# Patient Record
Sex: Male | Born: 1965
Health system: Southern US, Community
[De-identification: ages and names within clinical notes are randomized; demographics above are authoritative.]

## PROBLEM LIST (undated history)

## (undated) DIAGNOSIS — Z9989 Dependence on other enabling machines and devices: Secondary | ICD-10-CM

## (undated) DIAGNOSIS — I4892 Unspecified atrial flutter: Secondary | ICD-10-CM

## (undated) DIAGNOSIS — E781 Pure hyperglyceridemia: Secondary | ICD-10-CM

## (undated) DIAGNOSIS — E785 Hyperlipidemia, unspecified: Secondary | ICD-10-CM

## (undated) DIAGNOSIS — I4819 Other persistent atrial fibrillation: Secondary | ICD-10-CM

## (undated) DIAGNOSIS — Z89511 Acquired absence of right leg below knee: Secondary | ICD-10-CM

## (undated) DIAGNOSIS — G4733 Obstructive sleep apnea (adult) (pediatric): Secondary | ICD-10-CM

## (undated) DIAGNOSIS — Z23 Encounter for immunization: Secondary | ICD-10-CM

## (undated) HISTORY — DX: Other persistent atrial fibrillation: I48.19

## (undated) HISTORY — DX: Hyperlipidemia, unspecified: E78.5

## (undated) HISTORY — DX: Pure hyperglyceridemia: E78.1

## (undated) HISTORY — DX: Unspecified atrial flutter: I48.92

## (undated) HISTORY — DX: Acquired absence of right leg below knee: Z89.511

## (undated) HISTORY — DX: Encounter for immunization: Z23

---

## 2002-05-18 HISTORY — PX: BELOW KNEE LEG AMPUTATION: SUR23

## 2010-11-21 ENCOUNTER — Encounter: Payer: Self-pay | Admitting: Cardiology

## 2011-01-14 ENCOUNTER — Ambulatory Visit (INDEPENDENT_AMBULATORY_CARE_PROVIDER_SITE_OTHER): Payer: BC Managed Care – PPO | Admitting: Cardiology

## 2011-01-14 ENCOUNTER — Encounter: Payer: Self-pay | Admitting: Cardiology

## 2011-01-14 DIAGNOSIS — E781 Pure hyperglyceridemia: Secondary | ICD-10-CM

## 2011-01-14 DIAGNOSIS — R002 Palpitations: Secondary | ICD-10-CM

## 2011-01-14 HISTORY — DX: Pure hyperglyceridemia: E78.1

## 2011-01-14 NOTE — Assessment & Plan Note (Signed)
His palpitations have been more frequent. He has a normal cardiac exam and ECG. Recent blood work apparently was unremarkable. We will schedule him for a 24-hour Holter monitor.

## 2011-01-14 NOTE — Patient Instructions (Signed)
We will have you wear a Holter monitor and call you with the results.
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
## 2011-01-23 ENCOUNTER — Telehealth: Payer: Self-pay | Admitting: *Deleted

## 2011-01-23 DIAGNOSIS — I4891 Unspecified atrial fibrillation: Secondary | ICD-10-CM

## 2011-01-23 NOTE — Telephone Encounter (Signed)
Spoke w/wife. Notified of holter monitor results-showed Afib. Per Dr. [place] needs echo. Called Dr. Alda Berthold office and got lab results. They checked his thyroid-nml. Scheduled Echo for 9/12.

## 2011-01-28 ENCOUNTER — Telehealth: Payer: Self-pay | Admitting: *Deleted

## 2011-01-28 ENCOUNTER — Ambulatory Visit (HOSPITAL_COMMUNITY): Payer: BC Managed Care – PPO | Attending: Cardiology | Admitting: Radiology

## 2011-01-28 DIAGNOSIS — R002 Palpitations: Secondary | ICD-10-CM | POA: Insufficient documentation

## 2011-01-28 DIAGNOSIS — R072 Precordial pain: Secondary | ICD-10-CM

## 2011-01-28 DIAGNOSIS — I4891 Unspecified atrial fibrillation: Secondary | ICD-10-CM | POA: Insufficient documentation

## 2011-01-28 DIAGNOSIS — I079 Rheumatic tricuspid valve disease, unspecified: Secondary | ICD-10-CM | POA: Insufficient documentation

## 2011-01-28 NOTE — Telephone Encounter (Signed)
Message copied by Lorayne Bender on Wed Jan 28, 2011  2:18 PM ------      Message from: [place], PETER M      Created: Wed Jan 28, 2011 12:47 PM       Echo looks very good, only mild LA enlargement. Otherwise normal      Peter [place]

## 2011-01-28 NOTE — Telephone Encounter (Signed)
Lm w/echo results. Still will need to see next week 9/19.

## 2011-02-04 ENCOUNTER — Encounter: Payer: Self-pay | Admitting: Cardiology

## 2011-02-04 ENCOUNTER — Ambulatory Visit (INDEPENDENT_AMBULATORY_CARE_PROVIDER_SITE_OTHER): Payer: BC Managed Care – PPO | Admitting: Cardiology

## 2011-02-04 VITALS — BP 118/80 | HR 78 | Ht 73.0 in | Wt 218.0 lb

## 2011-02-04 DIAGNOSIS — I4891 Unspecified atrial fibrillation: Secondary | ICD-10-CM

## 2011-02-04 DIAGNOSIS — I480 Paroxysmal atrial fibrillation: Secondary | ICD-10-CM | POA: Insufficient documentation

## 2011-02-04 MED ORDER — METOPROLOL SUCCINATE ER 25 MG PO TB24: 25.0000 mg | ORAL_TABLET | Freq: Every day | ORAL | Status: DC

## 2011-02-04 NOTE — Assessment & Plan Note (Signed)
Holter monitor demonstrated a single episode of atrial fibrillation. The remainder of his workup has been unremarkable. This would place him at low risk category. His [place] score is 0. I recommended a trial of metoprolol 25 mg daily. He will remain on baby aspirin daily. Will followup again in 3 months. Since he is only minimally symptomatic I have not recommended antiarrhythmic drug therapy.

## 2011-02-04 NOTE — Patient Instructions (Signed)
We will try you on metoprolol 25 mg daily to see if this will reduce your palpitations.  Avoid caffeine.  I will see you again in 3 months.
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
## 2011-04-24 ENCOUNTER — Ambulatory Visit (INDEPENDENT_AMBULATORY_CARE_PROVIDER_SITE_OTHER): Payer: BC Managed Care – PPO | Admitting: Cardiology

## 2011-04-24 ENCOUNTER — Encounter: Payer: Self-pay | Admitting: Cardiology

## 2011-04-24 VITALS — BP 118/70 | HR 61 | Ht 73.0 in | Wt 223.0 lb

## 2011-04-24 DIAGNOSIS — I4891 Unspecified atrial fibrillation: Secondary | ICD-10-CM

## 2011-04-24 DIAGNOSIS — I480 Paroxysmal atrial fibrillation: Secondary | ICD-10-CM
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
## 2011-04-24 NOTE — Patient Instructions (Signed)
Continue on the Toprol and ASA.  I will see you again in 6 months.

## 2011-04-24 NOTE — Assessment & Plan Note (Signed)
He has had a fairly dramatic improvement with oral metoprolol. I recommended continuing on this and his baby aspirin. He'll follow up again in 6 months.

## 2015-05-03 ENCOUNTER — Ambulatory Visit (INDEPENDENT_AMBULATORY_CARE_PROVIDER_SITE_OTHER): Payer: BLUE CROSS/BLUE SHIELD | Admitting: Medical

## 2015-05-03 ENCOUNTER — Institutional Professional Consult (permissible substitution): Payer: Self-pay | Admitting: Medical

## 2015-05-03 ENCOUNTER — Encounter: Payer: Self-pay | Admitting: Medical

## 2015-05-03 VITALS — BP 120/82 | HR 83 | Ht 72.5 in | Wt 227.0 lb

## 2015-05-03 DIAGNOSIS — R000 Tachycardia, unspecified: Secondary | ICD-10-CM | POA: Diagnosis not present

## 2015-05-03 DIAGNOSIS — Z89511 Acquired absence of right leg below knee: Secondary | ICD-10-CM

## 2015-05-03 DIAGNOSIS — Z8249 Family history of ischemic heart disease and other diseases of the circulatory system: Secondary | ICD-10-CM | POA: Diagnosis not present

## 2015-05-03 DIAGNOSIS — Z449 Encounter for fitting and adjustment of unspecified external prosthetic device: Secondary | ICD-10-CM | POA: Diagnosis not present

## 2015-05-03 DIAGNOSIS — Z8679 Personal history of other diseases of the circulatory system: Secondary | ICD-10-CM

## 2015-05-03 NOTE — Progress Notes (Signed)
Subjective: Chief Complaint  Patient presents with  . New Patient (Initial Visit)  . prosthetic leg issues    needs rx for ouside and inside liner and socks etc. said he needs a new leg. but not sure how long the rx is good for   Here as a new patient today.   Doesn't go to the doctor regularly.   Has right BTK prosthesis.  Usually gets updated prosthesis every few years as technology changes.   Needs new liners and is due for new prosthesis.   Overall doing well, no particular c/o.  Exercise 3 days per week without problems.   Been Psychiatrist.     He does have hx/o paroxysmal Afib after using artificial sweetener back several years ago.   otherwise been in usual state of health.  Denies palpitations, SOB, chest pain, dyspnea, dizziness, fatigue.  No other aggravating or relieving factors. No other complaint.  Past Medical History  Diagnosis Date  . Hyperlipidemia   . Fatigue   . Paroxysmal a-fib (HCC)    ROS as in subjective  Objective: BP 120/82 mmHg  Pulse 83  Ht 6' 0.5" (1.842 m)  Wt 227 lb (102.967 kg)  BMI 30.35 kg/m2  SpO2 98%  Gen: wd, wn, nad white male Right BTK prosthesis in place, otherwise legs nontender, normal ROM Neck: supple, nontender, no mass, no thyromegaly Lungs clear Heart: tachycardic, otherwise RRR, no murmurs Pulses 2+ symmetric UE and LE No left leg edema    Assessment: Encounter Diagnoses  Name Primary?  . Status post below knee amputation of right lower extremity ([place]) Yes  . Prosthesis adjustment   . Tachycardia   . Family history of premature CAD   . History of atrial fibrillation     Plan: On pre sensation he was fine, never seemed to be symptomatic from a cardiac standpoint, but while checking vitals CMA had discrepancies on pulse, and after I personally checked his pulse he seemed to be tachycardic in the 140 range at rest.   Discussed his history , possible causes, and advised labs, EKG for help evaluate.   He  reports drinking sweet tea just before coming in today.  He refused any EKG or labs or other eval today.  discussed risks of not checking things out further.  At this point, he left against advice.   He states he will return for a physical and labs.   Advised that if he gets chest pain, dyspnea, SOB, palpations, or other symptoms as discussed to call 911 or get evaluated.   Otherwise he refused other eval today.  Discussed with Dr. Redmond School as well.   New RX given for prosthesis and supplies.    Return soon for physical, fasting labs

## 2015-05-04 ENCOUNTER — Encounter (HOSPITAL_COMMUNITY): Payer: Self-pay | Admitting: Emergency Medicine

## 2015-05-04 ENCOUNTER — Emergency Department (HOSPITAL_COMMUNITY)
Admission: EM | Admit: 2015-05-04 | Discharge: 2015-05-04 | Disposition: A | Discharge status: Home / Self Care | Payer: BLUE CROSS/BLUE SHIELD | Attending: Emergency Medicine | Admitting: Emergency Medicine

## 2015-05-04 DIAGNOSIS — I483 Typical atrial flutter: Secondary | ICD-10-CM | POA: Diagnosis not present

## 2015-05-04 DIAGNOSIS — I4892 Unspecified atrial flutter: Secondary | ICD-10-CM

## 2015-05-04 DIAGNOSIS — Z79899 Other long term (current) drug therapy: Secondary | ICD-10-CM | POA: Diagnosis not present

## 2015-05-04 DIAGNOSIS — Z8639 Personal history of other endocrine, nutritional and metabolic disease: Secondary | ICD-10-CM | POA: Diagnosis not present

## 2015-05-04 DIAGNOSIS — R002 Palpitations: Secondary | ICD-10-CM | POA: Diagnosis present

## 2015-05-04 DIAGNOSIS — Z7982 Long term (current) use of aspirin: Secondary | ICD-10-CM | POA: Insufficient documentation

## 2015-05-04 LAB — BASIC METABOLIC PANEL
ANION GAP: 7 (ref 5–15)
BUN: 13 mg/dL (ref 6–20)
CALCIUM: 9 mg/dL (ref 8.9–10.3)
CO2: 27 mmol/L (ref 22–32)
Chloride: 102 mmol/L (ref 101–111)
Creatinine, Ser: 0.98 mg/dL (ref 0.61–1.24)
GFR calc Af Amer: >60 mL/min (ref >60)
Glucose, Bld: 109 mg/dL — ABNORMAL HIGH (ref 65–99)
POTASSIUM: 4.1 mmol/L (ref 3.5–5.1)
SODIUM: 136 mmol/L (ref 135–145)

## 2015-05-04 LAB — CBC WITH DIFFERENTIAL/PLATELET
BASOS ABS: 0.1 10*3/uL (ref 0.0–0.1)
BASOS PCT: 1 %
EOS ABS: 0.2 10*3/uL (ref 0.0–0.7)
Eosinophils Relative: 2 %
HCT: 44.8 % (ref 39.0–52.0)
Hemoglobin: 15.2 g/dL (ref 13.0–17.0)
LYMPHS ABS: 1.7 10*3/uL (ref 0.7–4.0)
LYMPHS PCT: 25 %
MCH: 32.4 pg (ref 26.0–34.0)
MCHC: 33.9 g/dL (ref 30.0–36.0)
MCV: 95.5 fL (ref 78.0–100.0)
Monocytes Absolute: 0.5 10*3/uL (ref 0.1–1.0)
Monocytes Relative: 7 %
NEUTROS PCT: 65 %
Neutro Abs: 4.3 10*3/uL (ref 1.7–7.7)
PLATELETS: 210 10*3/uL (ref 150–400)
RBC: 4.69 MIL/uL (ref 4.22–5.81)
RDW: 12.5 % (ref 11.5–15.5)
WBC: 6.7 10*3/uL (ref 4.0–10.5)

## 2015-05-04 LAB — MAGNESIUM: MAGNESIUM: 1.9 mg/dL (ref 1.7–2.4)

## 2015-05-04 LAB — TSH: TSH: 1.632 u[IU]/mL (ref 0.350–4.500)

## 2015-05-04 MED ORDER — DILTIAZEM HCL ER COATED BEADS 120 MG PO TB24: 120.0000 mg | ORAL_TABLET | Freq: Every day | ORAL | Status: DC

## 2015-05-04 MED ORDER — DILTIAZEM HCL 100 MG IV SOLR
5.0000 mg/h | INTRAVENOUS | Status: DC
  Administered 2015-05-04: 5 mg/h via INTRAVENOUS
  Filled 2015-05-04: qty 100

## 2015-05-04 MED ORDER — DILTIAZEM LOAD VIA INFUSION
20.0000 mg | INTRAVENOUS | Status: DC | PRN
  Administered 2015-05-04: 20 mg via INTRAVENOUS
  Filled 2015-05-04: qty 20

## 2015-05-04 MED ORDER — ADENOSINE 6 MG/2ML IV SOLN
6.0000 mg | Freq: Once | INTRAVENOUS | Status: AC
  Administered 2015-05-04: 6 mg via INTRAVENOUS
  Filled 2015-05-04: qty 2
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
## 2015-05-04 NOTE — Discharge Instructions (Signed)
Atrial Flutter °Atrial flutter is a heart rhythm that can cause the heart to beat very fast (tachycardia). It originates in the upper chambers of the heart (atria). In atrial flutter, the top chambers of the heart (atria) often beat much faster than the bottom chambers of the heart (ventricles). Atrial flutter has a regular "saw toothed" appearance in an EKG readout. An EKG is a test that records the electrical activity of the heart. Atrial flutter can cause the heart to beat up to 150 beats per minute (BPM). Atrial flutter can either be short lived (paroxysmal) or permanent.  °CAUSES  °Causes of atrial flutter can be many. Some of these include: °· Heart related issues: °¨ Heart attack (myocardial infarction). °¨ Heart failure. °¨ Heart valve problems. °¨ Poorly controlled high blood pressure (hypertension). °¨ After open heart surgery. °· Lung related issues: °¨ A blood clot in the lungs (pulmonary embolism). °¨ Chronic obstructive pulmonary disease (COPD). Medications used to treat COPD can attribute to atrial flutter. °· Other related causes: °¨ Hyperthyroidism. °¨ Caffeine. °¨ Some decongestant cold medications. °¨ Low electrolyte levels such as potassium or magnesium. °¨ Cocaine. °SYMPTOMS °· An awareness of your heart beating rapidly (palpitations). °· Shortness of breath. °· Chest pain. °· Low blood pressure (hypotension). °· Dizziness or fainting. °DIAGNOSIS  °Different tests can be performed to diagnose atrial flutter.  °· An EKG. °· Holter monitor. This is a 24-hour recording of your heart rhythm. You will also be given a diary. Write down all symptoms that you have and what you were doing at the time you experienced symptoms. °· Cardiac event monitor. This small device can be worn for up to 30 days. When you have heart symptoms, you will push a button on the device. This will then record your heart rhythm. °· Echocardiogram. This is an imaging test to look at your heart. Your caregiver will look at your  heart valves and the ventricles. °· Stress test. This test can help determine if the atrial flutter is related to exercise or if coronary artery disease is present. °· Laboratory studies will look at certain blood levels like: °¨ Complete blood count (CBC). °¨ Potassium. °¨ Magnesium. °¨ Thyroid function. °TREATMENT  °Treatment of atrial flutter varies. A combination of therapies may be used or sometimes atrial flutter may need only 1 type of treatment.  °Lab work: °If your blood work, such as your electrolytes (potassium, magnesium) or your thyroid function tests, are abnormal, your caregiver will treat them accordingly.  °Medication:  °There are several different types of medications that can convert your heart to a normal rhythm and prevent atrial flutter from reoccurring.  °Nonsurgical procedures: °Nonsurgical techniques may be used to control atrial flutter. Some examples include: °· Cardioversion. This technique uses either drugs or an electrical shock to restore a normal heart rhythm: °¨ Cardioversion drugs may be given through an intravenous (IV) line to help "reset" the heart rhythm. °¨ In electrical cardioversion, your caregiver shocks your heart with electrical energy. This helps to reset the heartbeat to a normal rhythm. °· Ablation. If atrial flutter is a persistent problem, an ablation may be needed. This procedure is done under mild sedation. High frequency radio-wave energy is used to destroy the area of heart tissue responsible for atrial flutter. °SEEK IMMEDIATE MEDICAL CARE IF:  °You have: °· Dizziness. °· Near fainting or fainting. °· Shortness of breath. °· Chest pain or pressure. °· Sudden nausea or vomiting. °· Profuse sweating. °If you have the above symptoms,   call your local emergency service immediately! Do not drive yourself to the hospital. °MAKE SURE YOU:  °· Understand these instructions. °· Will watch your condition. °· Will get help right away if you are not doing well or get worse. °   °This information is not intended to replace advice given to you by your health care provider. Make sure you discuss any questions you have with your health care provider. °  °Document Released: 09/20/2008 Document Revised: 05/25/2014 Document Reviewed: 11/16/2014 °Elsevier Interactive Patient Education ©2016 [employer] ° °

## 2015-05-04 NOTE — ED Notes (Signed)
Pt reports he has felt his heart beat fast off and on since last SAT. Pt reports a HX of A-Fib.

## 2015-05-04 NOTE — ED Notes (Signed)
Declined W/C at D/C and was escorted to lobby by RN. 
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
## 2015-05-06 ENCOUNTER — Telehealth: Payer: Self-pay | Admitting: Medical

## 2015-05-06 ENCOUNTER — Ambulatory Visit (INDEPENDENT_AMBULATORY_CARE_PROVIDER_SITE_OTHER): Payer: BLUE CROSS/BLUE SHIELD | Admitting: Physician Assistant

## 2015-05-06 ENCOUNTER — Encounter: Payer: Self-pay | Admitting: Physician Assistant

## 2015-05-06 VITALS — BP 122/80 | HR 77 | Ht 73.0 in | Wt 230.5 lb

## 2015-05-06 DIAGNOSIS — E781 Pure hyperglyceridemia: Secondary | ICD-10-CM | POA: Diagnosis not present

## 2015-05-06 DIAGNOSIS — I480 Paroxysmal atrial fibrillation: Secondary | ICD-10-CM

## 2015-05-06 MED ORDER — DILTIAZEM HCL ER COATED BEADS 180 MG PO CP24: 180.0000 mg | ORAL_CAPSULE | Freq: Every day | ORAL | Status: DC

## 2015-05-06 NOTE — Telephone Encounter (Signed)
Pt is on the way to cariology, [name], on [address]. Said he was in Afib and that he feels fine.  This weekend he said he was not feeling good at all. Has appt 05/07/15 with [place]

## 2015-05-06 NOTE — Patient Instructions (Addendum)
Your physician recommends that you schedule a follow-up appointment in: 3 Months with Dr Martinique  Your physician has requested that you have an echocardiogram. Echocardiography is a painless test that uses sound waves to create images of your heart. It provides your doctor with information about the size and shape of your heart and how well your heart's chambers and valves are working. This procedure takes approximately one hour. There are no restrictions for this procedure.  Your physician has recommended you make the following change in your medication: Increase Diltiazem 180 mg daily.
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
## 2015-05-06 NOTE — Telephone Encounter (Signed)
Please call and see how he is doing.  I got notification of his hospital visit, and glad he was treated to get the heart rate under control.   We pray he is doing well today.

## 2015-05-07 ENCOUNTER — Telehealth: Payer: Self-pay

## 2015-05-07 ENCOUNTER — Encounter: Payer: Self-pay | Admitting: Medical

## 2015-05-07 ENCOUNTER — Telehealth: Payer: Self-pay | Admitting: Cardiology

## 2015-05-07 ENCOUNTER — Ambulatory Visit (INDEPENDENT_AMBULATORY_CARE_PROVIDER_SITE_OTHER): Payer: BLUE CROSS/BLUE SHIELD | Admitting: Medical

## 2015-05-07 VITALS — BP 130/88 | HR 160 | Ht 73.0 in | Wt 225.0 lb

## 2015-05-07 DIAGNOSIS — Z89511 Acquired absence of right leg below knee: Secondary | ICD-10-CM | POA: Diagnosis not present

## 2015-05-07 DIAGNOSIS — I4819 Other persistent atrial fibrillation: Secondary | ICD-10-CM

## 2015-05-07 DIAGNOSIS — Z125 Encounter for screening for malignant neoplasm of prostate: Secondary | ICD-10-CM | POA: Insufficient documentation

## 2015-05-07 DIAGNOSIS — Z0000 Encounter for general adult medical examination without abnormal findings: Secondary | ICD-10-CM | POA: Insufficient documentation

## 2015-05-07 DIAGNOSIS — Z23 Encounter for immunization: Secondary | ICD-10-CM | POA: Insufficient documentation

## 2015-05-07 DIAGNOSIS — I481 Persistent atrial fibrillation: Secondary | ICD-10-CM

## 2015-05-07 HISTORY — DX: Encounter for immunization: Z23

## 2015-05-07 HISTORY — DX: Acquired absence of right leg below knee: Z89.511

## 2015-05-07 HISTORY — DX: Other persistent atrial fibrillation: I48.19

## 2015-05-07 LAB — HEPATIC FUNCTION PANEL
ALBUMIN: 4.5 g/dL (ref 3.6–5.1)
ALK PHOS: 92 U/L (ref 40–115)
ALT: 73 U/L — ABNORMAL HIGH (ref 9–46)
AST: 30 U/L (ref 10–40)
BILIRUBIN DIRECT: 0.2 mg/dL (ref <=0.2)
BILIRUBIN TOTAL: 1.2 mg/dL (ref 0.2–1.2)
Indirect Bilirubin: 1 mg/dL (ref 0.2–1.2)
Total Protein: 7.2 g/dL (ref 6.1–8.1)

## 2015-05-07 LAB — LIPID PANEL
CHOL/HDL RATIO: 5.2 ratio — AB (ref <=5.0)
CHOLESTEROL: 219 mg/dL — AB (ref 125–200)
HDL: 42 mg/dL (ref >=40)
LDL Cholesterol: 148 mg/dL — ABNORMAL HIGH (ref <130)
Triglycerides: 147 mg/dL (ref <150)
VLDL: 29 mg/dL (ref <30)

## 2015-05-07 LAB — POCT URINALYSIS DIPSTICK
Bilirubin, UA: NEGATIVE
Blood, UA: NEGATIVE
GLUCOSE UA: NEGATIVE
Ketones, UA: NEGATIVE
LEUKOCYTES UA: NEGATIVE
NITRITE UA: NEGATIVE
PROTEIN UA: NEGATIVE
SPEC GRAV UA: 1.015
UROBILINOGEN UA: NEGATIVE
pH, UA: 7.5

## 2015-05-07 NOTE — Telephone Encounter (Signed)
I would get him into the Afib clinic. He has atrial flutter and may be a good candidate for ablation.   Peter Martinique MD, [facility]

## 2015-05-07 NOTE — Telephone Encounter (Signed)
Returned call to [place] with [place] left message on personal voice mail Dr.Jordan's recommendations.Appointment scheduled at AFib clinic 05/14/15 at 3:00 pm.  Spoke to patient he is aware of appointment.Advised he will receive a letter in mail with directions where to go.Advised to call AFib clinic if he does not receive letter.AFib clinic # given.

## 2015-05-07 NOTE — Telephone Encounter (Signed)
Called the office and the cardiologist office should get back to [place] today. I told them that you leave at 2 and i leave at 3 so if it is urgent i will get sabrina and amber to listen out

## 2015-05-07 NOTE — Telephone Encounter (Signed)
Returned call to [place] with [employer].She stated patient had a physical today and was complaining of fast heart beat.Stated while he was in office pulse 80 to 160 BPM.B/P 130/88. Stated he saw Tarri Fuller PA 05/06/15, Diltiazem increased to 180 mg daily.Stated [employer] PA wanted her to ask Dr.Jordan if Diltiazem can be increased or does he need to be referred to AFib clinic.Message sent to [place] for advice.

## 2015-05-07 NOTE — Telephone Encounter (Signed)
Dr Morrison Old nurse called and is sending all i said from todays visit to dr Martinique and will call back with his advise

## 2015-05-07 NOTE — Progress Notes (Signed)
Subjective:   HPI  Jon [name] is a 49 y.o. male who presents for a complete physical.  I just met him recently as a new patient for updated script for prosthesis since he is s/p BTK amputation on the right, but over the weekend he had episode of tachycardia and afib, ended up being admitted to hospital, was begun on medication.   He doesn't feel great today, feels like the pulses is tachy and still in afib despite the dose of Cardizem being increased yesterday.  otherwise he has been in his normal state of health.  Reviewed their medical, surgical, family, social, medication, and allergy history and updated chart as appropriate.  Past Medical History  Diagnosis Date  . Hyperlipidemia   . Fatigue   . Paroxysmal a-fib [facility])     Past Surgical History  Procedure Laterality Date  . Below knee leg amputation      RIGHT LEG    Social History   Social History  . Marital Status: Married    Spouse Name: N/A  . Number of Children: 1  . Years of Education: N/A   Occupational History  . supervisor     [employer]   Social History Main Topics  . Smoking status: Never Smoker   . Smokeless tobacco: Never Used  . Alcohol Use: 1.8 oz/week    0 Standard drinks or equivalent, 3 Cans of beer per week  . Drug Use: No  . Sexual Activity: Not on file   Other Topics Concern  . Not on file   Social History Narrative   Married, 1 child, 24yo, exercises 3 times per week, eats healthy.   Works as Naval architect at [employer], Programmer, systems.     Family History  Problem Relation Age of Onset  . Heart disease Father 41    CABG  . Heart disease Brother 10    died of MI  . Cancer Neg Hx   . Stroke Neg Hx   . Diabetes Neg Hx      Current outpatient prescriptions:  .  aspirin 81 MG tablet, Take 81 mg by mouth daily.  , Disp: , Rfl:  .  diltiazem (CARDIZEM CD) 180 MG 24 hr capsule, Take 1 capsule (180 mg total) by mouth daily., Disp: 90 capsule, Rfl: 3 .  fish  oil-omega-3 fatty acids 1000 MG capsule, Take 2,400 mg by mouth daily.  , Disp: , Rfl:  .  Magnesium 500 MG TABS, Take 1 tablet by mouth daily., Disp: , Rfl:  .  Multiple Vitamin (MULTIVITAMIN) tablet, Take 1 tablet by mouth daily.  , Disp: , Rfl:   No Known Allergies   Review of Systems Constitutional: -fever, -chills, -sweats, -unexpected weight change, -decreased appetite, -fatigue Allergy: -sneezing, -itching, -congestion Dermatology: -changing moles, --rash, -lumps ENT: -runny nose, -ear pain, -sore throat, -hoarseness, -sinus pain, -teeth pain, - ringing in ears, -hearing loss, -nosebleeds Cardiology: -chest pain, +palpitations, -swelling, -difficulty breathing when lying flat, -waking up short of breath Respiratory: -cough, -shortness of breath, -difficulty breathing with exercise or exertion, -wheezing, -coughing up blood Gastroenterology: -abdominal pain, -nausea, -vomiting, -diarrhea, -constipation, -blood in stool, -changes in bowel movement, -difficulty swallowing or eating Hematology: -bleeding, -bruising  Musculoskeletal: -joint aches, -muscle aches, -joint swelling, -back pain, -neck pain, -cramping, -changes in gait Ophthalmology: denies vision changes, eye redness, itching, discharge Urology: -burning with urination, -difficulty urinating, -blood in urine, -urinary frequency, -urgency, -incontinence Neurology: -headache, -weakness, -tingling, -numbness, -memory loss, -falls, -dizziness Psychology: -depressed mood, -agitation, -  sleep problems     Objective:   Physical Exam  BP 130/88 mmHg  Pulse 80  Ht _0  (1.854 m)  Wt 225 lb (102.059 kg)  BMI 29.69 kg/m2  General appearance: alert, no distress, WD/WN,  Skin:scattered macules, no worrisome lesions HEENT: normocephalic, conjunctiva/corneas normal, sclerae anicteric, PERRLA, EOMi, nares patent, no discharge or erythema, pharynx normal Oral cavity: MMM, tongue normal, teeth in good repair Neck: supple, no  lymphadenopathy, no thyromegaly, no masses, normal ROM, no bruits Chest: non tender, normal shape and expansion Heart: tachycardic at 160, otherwise RRR, normal S1, S2, no murmurs Lungs: CTA bilaterally, no wheezes, rhonchi, or rales Abdomen: +bs, soft, non tender, non distended, no masses, no hepatomegaly, no splenomegaly, no bruits Back: non tender, normal ROM, no scoliosis Musculoskeletal: s/p right BTK amputation, otherwise upper extremities non tender, no obvious deformity, normal ROM throughout, lower extremities non tender, no obvious deformity, normal ROM throughout Extremities: no edema, no cyanosis, no clubbing Pulses: 2+ symmetric, upper and lower extremities, normal cap refill Neurological: alert, oriented x 3, CN2-12 intact, strength normal upper extremities and lower extremities, sensation normal throughout, DTRs 2-3+ throughout, no cerebellar signs, gait normal Psychiatric: normal affect, behavior normal, pleasant  GU: normal male external genitalia, circumcised, nontender, no masses, no hernia, no lymphadenopathy Rectal: deferred   Assessment and Plan :   Encounter Diagnoses  Name Primary?  . Routine general medical examination at a health care facility Yes  . Need for prophylactic vaccination and inoculation against influenza   . Persistent atrial fibrillation ([place])   . Status post below knee amputation of right lower extremity (HCC)    Physical exam - discussed healthy lifestyle, diet, exercise, preventative care, vaccinations, and addressed their concerns.   Reviewed the recent hospitalization notes, labs, and he has upcoming echocardiogram scheduled Counseled on the influenza virus vaccine.  Vaccine information sheet given.  Influenza vaccine given after consent obtained. afib - we called cardiology for advice.  Per their note yesterday, he may need f/u in Afib clinic or other treatment recommendations.    Advised he c/t Cardizem and he should hear back from [place] or  cardiology no later than in the morning. See your eye doctor yearly for routine vision care. See your dentist yearly for routine dental care including hygiene visits twice yearly. Discussed PSA and prostate cancer screening.  He defers til age 72yo. Abnormal vision screen - Of note he didn't have his glasses with him, but usually wears them. Follow-up pending labs

## 2015-05-07 NOTE — Telephone Encounter (Signed)
Returned call to [place] with [employer].[place].

## 2015-05-07 NOTE — Telephone Encounter (Signed)
°  New Prob   Pt had CPE with primary care PA and stated that he feels Cardizem after increasing dose is not working. Shawn Tysinger PA-C calling to see if pt needs to be seen by use again. Please call.

## 2015-05-08 ENCOUNTER — Encounter: Payer: Self-pay | Admitting: Medical

## 2015-05-08 ENCOUNTER — Telehealth: Payer: Self-pay

## 2015-05-08 LAB — HEMOGLOBIN A1C
HEMOGLOBIN A1C: 5.8 % — AB (ref <5.7)
Mean Plasma Glucose: 120 mg/dL — ABNORMAL HIGH (ref <117)

## 2015-05-08 NOTE — Telephone Encounter (Signed)
Got a call from Dr Morrison Old office they set him up an appt at the [facility] 05/14/15 at 3pm. Pt is aware

## 2015-05-08 NOTE — Telephone Encounter (Signed)
Any word on the cardiology f/u?

## 2015-05-08 NOTE — Telephone Encounter (Signed)
05/14/15 at 3pm he has an appt for the afib clinic and pt is aware

## 2015-05-14 ENCOUNTER — Ambulatory Visit (HOSPITAL_COMMUNITY)
Admission: RE | Admit: 2015-05-14 | Discharge: 2015-05-14 | Disposition: A | Discharge status: Home / Self Care | Payer: BLUE CROSS/BLUE SHIELD | Source: Ambulatory Visit | Attending: Nurse Practitioner | Admitting: Nurse Practitioner

## 2015-05-14 VITALS — BP 124/94 | HR 161 | Ht 73.0 in | Wt 229.0 lb

## 2015-05-14 DIAGNOSIS — I483 Typical atrial flutter: Secondary | ICD-10-CM

## 2015-05-14 DIAGNOSIS — I4892 Unspecified atrial flutter: Secondary | ICD-10-CM | POA: Diagnosis present

## 2015-05-14 NOTE — Patient Instructions (Signed)
Friday, January 6th @ 8:30am Dr. Allegra Lai [phone] [id number]

## 2015-05-15 ENCOUNTER — Other Ambulatory Visit (HOSPITAL_COMMUNITY): Payer: Self-pay

## 2015-05-15 MED ORDER — RIVAROXABAN 20 MG PO TABS: 20.0000 mg | ORAL_TABLET | Freq: Every day | ORAL | Status: DC

## 2015-05-15 MED ORDER — DILTIAZEM HCL ER COATED BEADS 180 MG PO CP24: 180.0000 mg | ORAL_CAPSULE | Freq: Two times a day (BID) | ORAL | Status: DC

## 2015-05-15 NOTE — Addendum Note (Signed)
Encounter addended by: Sherran Needs, NP on: 05/15/2015  4:35 PM<BR>     Documentation filed: Notes Section
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
## 2015-05-21 ENCOUNTER — Ambulatory Visit (HOSPITAL_COMMUNITY)

## 2015-05-21 ENCOUNTER — Emergency Department (HOSPITAL_COMMUNITY): Payer: BLUE CROSS/BLUE SHIELD

## 2015-05-21 ENCOUNTER — Encounter: Payer: Self-pay | Admitting: *Deleted

## 2015-05-21 ENCOUNTER — Encounter (HOSPITAL_COMMUNITY): Payer: Self-pay | Admitting: *Deleted

## 2015-05-21 ENCOUNTER — Inpatient Hospital Stay (HOSPITAL_COMMUNITY)
Admission: EM | Admit: 2015-05-21 | Discharge: 2015-05-23 | DRG: 310 | Disposition: A | Discharge status: Home / Self Care | Payer: BLUE CROSS/BLUE SHIELD | Attending: Cardiology | Admitting: Cardiology

## 2015-05-21 DIAGNOSIS — I483 Typical atrial flutter: Secondary | ICD-10-CM

## 2015-05-21 DIAGNOSIS — Z8249 Family history of ischemic heart disease and other diseases of the circulatory system: Secondary | ICD-10-CM

## 2015-05-21 DIAGNOSIS — I480 Paroxysmal atrial fibrillation: Secondary | ICD-10-CM | POA: Diagnosis present

## 2015-05-21 DIAGNOSIS — I4892 Unspecified atrial flutter: Secondary | ICD-10-CM

## 2015-05-21 DIAGNOSIS — Z23 Encounter for immunization: Secondary | ICD-10-CM

## 2015-05-21 DIAGNOSIS — Z79899 Other long term (current) drug therapy: Secondary | ICD-10-CM

## 2015-05-21 DIAGNOSIS — Z89511 Acquired absence of right leg below knee: Secondary | ICD-10-CM

## 2015-05-21 DIAGNOSIS — E781 Pure hyperglyceridemia: Secondary | ICD-10-CM | POA: Diagnosis present

## 2015-05-21 HISTORY — DX: Unspecified atrial flutter: I48.92

## 2015-05-21 LAB — COMPREHENSIVE METABOLIC PANEL
ALT: 26 U/L (ref 17–63)
AST: 23 U/L (ref 15–41)
Albumin: 3.3 g/dL — ABNORMAL LOW (ref 3.5–5.0)
Alkaline Phosphatase: 94 U/L (ref 38–126)
Anion gap: 11 (ref 5–15)
BUN: 10 mg/dL (ref 6–20)
CHLORIDE: 105 mmol/L (ref 101–111)
CO2: 23 mmol/L (ref 22–32)
CREATININE: 0.73 mg/dL (ref 0.61–1.24)
Calcium: 9.1 mg/dL (ref 8.9–10.3)
GFR calc Af Amer: >60 mL/min (ref >60)
GLUCOSE: 135 mg/dL — AB (ref 65–99)
Potassium: 4.5 mmol/L (ref 3.5–5.1)
Sodium: 139 mmol/L (ref 135–145)
Total Bilirubin: 1.1 mg/dL (ref 0.3–1.2)
Total Protein: 6.8 g/dL (ref 6.5–8.1)

## 2015-05-21 LAB — CBC WITH DIFFERENTIAL/PLATELET
Basophils Absolute: 0 10*3/uL (ref 0.0–0.1)
Basophils Relative: 1 %
EOS PCT: 5 %
Eosinophils Absolute: 0.3 10*3/uL (ref 0.0–0.7)
HCT: 44.9 % (ref 39.0–52.0)
Hemoglobin: 15.2 g/dL (ref 13.0–17.0)
LYMPHS PCT: 33 %
Lymphs Abs: 2 10*3/uL (ref 0.7–4.0)
MCH: 31.9 pg (ref 26.0–34.0)
MCHC: 33.9 g/dL (ref 30.0–36.0)
MCV: 94.1 fL (ref 78.0–100.0)
Monocytes Absolute: 0.5 10*3/uL (ref 0.1–1.0)
Monocytes Relative: 7 %
NEUTROS ABS: 3.3 10*3/uL (ref 1.7–7.7)
Neutrophils Relative %: 54 %
PLATELETS: 230 10*3/uL (ref 150–400)
RBC: 4.77 MIL/uL (ref 4.22–5.81)
RDW: 12.5 % (ref 11.5–15.5)
WBC: 6.2 10*3/uL (ref 4.0–10.5)

## 2015-05-21 LAB — TROPONIN I

## 2015-05-21 MED ORDER — ASPIRIN 81 MG PO CHEW
324.0000 mg | CHEWABLE_TABLET | Freq: Once | ORAL | Status: DC
  Filled 2015-05-21: qty 4

## 2015-05-21 MED ORDER — SODIUM CHLORIDE 0.9 % IV SOLN: 250.0000 mL | INTRAVENOUS | Status: DC

## 2015-05-21 MED ORDER — DILTIAZEM HCL 100 MG IV SOLR
5.0000 mg/h | Freq: Once | INTRAVENOUS | Status: AC
  Administered 2015-05-21: 10 mg/h via INTRAVENOUS
  Administered 2015-05-21: 5 mg/h via INTRAVENOUS
  Filled 2015-05-21: qty 100

## 2015-05-21 MED ORDER — OMEGA-3-ACID ETHYL ESTERS 1 G PO CAPS
1.0000 g | ORAL_CAPSULE | ORAL | Status: DC
  Administered 2015-05-21 – 2015-05-23 (×3): 1 g via ORAL
  Filled 2015-05-21 (×3): qty 1

## 2015-05-21 MED ORDER — DILTIAZEM HCL 100 MG IV SOLR
5.0000 mg/h | Freq: Once | INTRAVENOUS | Status: AC
  Administered 2015-05-21: 15 mg/h via INTRAVENOUS
  Filled 2015-05-21: qty 100

## 2015-05-21 MED ORDER — DILTIAZEM HCL 100 MG IV SOLR
5.0000 mg/h | INTRAVENOUS | Status: DC
  Administered 2015-05-21: 5 mg/h via INTRAVENOUS
  Administered 2015-05-21: 10 mg/h via INTRAVENOUS
  Administered 2015-05-22 – 2015-05-23 (×5): 15 mg/h via INTRAVENOUS
  Filled 2015-05-21 (×6): qty 100

## 2015-05-21 MED ORDER — SODIUM CHLORIDE 0.9 % IJ SOLN: 3.0000 mL | Freq: Two times a day (BID) | INTRAMUSCULAR | Status: DC

## 2015-05-21 MED ORDER — DILTIAZEM HCL 25 MG/5ML IV SOLN
10.0000 mg | Freq: Once | INTRAVENOUS | Status: AC
  Administered 2015-05-21: 10 mg via INTRAVENOUS

## 2015-05-21 MED ORDER — ADULT MULTIVITAMIN W/MINERALS CH
1.0000 | ORAL_TABLET | ORAL | Status: DC
  Administered 2015-05-21 – 2015-05-23 (×3): 1 via ORAL
  Filled 2015-05-21 (×3): qty 1

## 2015-05-21 MED ORDER — SODIUM CHLORIDE 0.9 % IJ SOLN: 3.0000 mL | INTRAMUSCULAR | Status: DC | PRN

## 2015-05-21 MED ORDER — MAGNESIUM OXIDE 400 (241.3 MG) MG PO TABS
800.0000 mg | ORAL_TABLET | ORAL | Status: DC
  Administered 2015-05-21 – 2015-05-23 (×3): 800 mg via ORAL
  Filled 2015-05-21 (×3): qty 2

## 2015-05-21 MED ORDER — DILTIAZEM HCL ER COATED BEADS 180 MG PO CP24
180.0000 mg | ORAL_CAPSULE | Freq: Two times a day (BID) | ORAL | Status: DC
  Administered 2015-05-21 – 2015-05-23 (×3): 180 mg via ORAL
  Filled 2015-05-21 (×3): qty 1

## 2015-05-21 MED ORDER — ONDANSETRON HCL 4 MG/2ML IJ SOLN: 4.0000 mg | Freq: Four times a day (QID) | INTRAMUSCULAR | Status: DC | PRN

## 2015-05-21 MED ORDER — SODIUM CHLORIDE 0.9 % IV SOLN
INTRAVENOUS | Status: DC
  Administered 2015-05-21: 20 mL via INTRAVENOUS

## 2015-05-21 MED ORDER — ACETAMINOPHEN 325 MG PO TABS: 650.0000 mg | ORAL_TABLET | ORAL | Status: DC | PRN

## 2015-05-21 MED ORDER — RIVAROXABAN 20 MG PO TABS
20.0000 mg | ORAL_TABLET | ORAL | Status: DC
Start: 2015-05-21 — End: 2015-05-23
  Administered 2015-05-21 – 2015-05-23 (×3): 20 mg via ORAL
  Filled 2015-05-21 (×3): qty 1

## 2015-05-21 MED ORDER — METOPROLOL TARTRATE 12.5 MG HALF TABLET
12.5000 mg | ORAL_TABLET | Freq: Two times a day (BID) | ORAL | Status: DC
  Administered 2015-05-21 – 2015-05-22 (×3): 12.5 mg via ORAL
  Filled 2015-05-21 (×3): qty 1

## 2015-05-21 NOTE — ED Notes (Signed)
Pt aware and informed by cards PA that he can eat.

## 2015-05-21 NOTE — Progress Notes (Signed)
Paged Dr. Aundra Dubin to make him aware Cardizem gtt was turned off during day shift for HR 40's-50's.   M.D aware. RN will give PO Cardizem 180mg  tonight and continue to monitor.  Arnell Sieving, RN

## 2015-05-21 NOTE — ED Notes (Signed)
HR ranging between 89-131 at this time. MD aware.

## 2015-05-21 NOTE — ED Notes (Signed)
Pt's heart rated noted to be 58 on monitor, last systolic BP was 98. Cardizem decreased to 10cc/hr.

## 2015-05-21 NOTE — ED Notes (Signed)
Pt to ED from home c/o tachycardia and pounding heart rate onset this morning . Pt was diagnosed 3 weeks ago with a-flutter; reports feeling an intense pounding heart rate. Pt started taking xarleto and cardizem three weeks ago. Is supposed to meet with cardiology today and on Friday for ablation Denies shortness of breath, did have an episode of diaphoresis.

## 2015-05-21 NOTE — Progress Notes (Signed)
Pt heart rate 47-55 on monitor.  Cardizem drip paused, was at 6 cc/hr.  Will cont to monitor HR.
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
## 2015-05-21 NOTE — Progress Notes (Signed)
Pt arrived to unit.  Pt arrived on cardizem drip running @ 10 ml/hr for atrial flutter.  Pt placed on telemetry and CCMD notified and confirmed of placement.  Dietary notified of Pt arrival, Pt to get lunch from wife from [place].  Pt educated on call light and telephone.  Pt denies chest pain or feeling of heart racing.  Heart rate stable at this time.  Will cont to monitor.
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
## 2015-05-22 DIAGNOSIS — I4892 Unspecified atrial flutter: Secondary | ICD-10-CM | POA: Diagnosis present

## 2015-05-22 DIAGNOSIS — Z89511 Acquired absence of right leg below knee: Secondary | ICD-10-CM | POA: Diagnosis not present

## 2015-05-22 DIAGNOSIS — Z8249 Family history of ischemic heart disease and other diseases of the circulatory system: Secondary | ICD-10-CM | POA: Diagnosis not present

## 2015-05-22 DIAGNOSIS — E781 Pure hyperglyceridemia: Secondary | ICD-10-CM | POA: Diagnosis present

## 2015-05-22 DIAGNOSIS — I480 Paroxysmal atrial fibrillation: Secondary | ICD-10-CM | POA: Diagnosis present

## 2015-05-22 DIAGNOSIS — I340 Nonrheumatic mitral (valve) insufficiency: Secondary | ICD-10-CM | POA: Diagnosis not present

## 2015-05-22 DIAGNOSIS — Z79899 Other long term (current) drug therapy: Secondary | ICD-10-CM | POA: Diagnosis not present

## 2015-05-22 DIAGNOSIS — Z23 Encounter for immunization: Secondary | ICD-10-CM | POA: Diagnosis not present

## 2015-05-22 LAB — BASIC METABOLIC PANEL
ANION GAP: 8 (ref 5–15)
BUN: 10 mg/dL (ref 6–20)
CALCIUM: 9.2 mg/dL (ref 8.9–10.3)
CO2: 29 mmol/L (ref 22–32)
CREATININE: 0.95 mg/dL (ref 0.61–1.24)
Chloride: 103 mmol/L (ref 101–111)
GFR calc Af Amer: >60 mL/min (ref >60)
GFR calc non Af Amer: >60 mL/min (ref >60)
Glucose, Bld: 120 mg/dL — ABNORMAL HIGH (ref 65–99)
Potassium: 4 mmol/L (ref 3.5–5.1)
Sodium: 140 mmol/L (ref 135–145)

## 2015-05-22 LAB — TROPONIN I: Troponin I: <0.03 ng/mL (ref <0.031)

## 2015-05-22 LAB — LIPID PANEL
CHOLESTEROL: 185 mg/dL (ref 0–200)
HDL: 33 mg/dL — AB (ref >40)
LDL CALC: 117 mg/dL — AB (ref 0–99)
TRIGLYCERIDES: 176 mg/dL — AB (ref <150)
Total CHOL/HDL Ratio: 5.6 RATIO
VLDL: 35 mg/dL (ref 0–40)

## 2015-05-22 MED ORDER — METOPROLOL TARTRATE 25 MG PO TABS
25.0000 mg | ORAL_TABLET | Freq: Two times a day (BID) | ORAL | Status: DC
  Administered 2015-05-22 – 2015-05-23 (×3): 25 mg via ORAL
  Filled 2015-05-22 (×3): qty 1

## 2015-05-22 MED ORDER — SODIUM CHLORIDE 0.9 % IJ SOLN: 3.0000 mL | INTRAMUSCULAR | Status: DC | PRN

## 2015-05-22 MED ORDER — SODIUM CHLORIDE 0.9 % IJ SOLN
3.0000 mL | Freq: Two times a day (BID) | INTRAMUSCULAR | Status: DC
  Administered 2015-05-22: 3 mL via INTRAVENOUS

## 2015-05-22 MED ORDER — SODIUM CHLORIDE 0.9 % IV SOLN
250.0000 mL | INTRAVENOUS | Status: DC
  Administered 2015-05-23: 13:00:00 via INTRAVENOUS

## 2015-05-22 MED ORDER — SODIUM CHLORIDE 0.9 % IV SOLN
INTRAVENOUS | Status: DC
  Administered 2015-05-22: 21:00:00 via INTRAVENOUS

## 2015-05-22 NOTE — Progress Notes (Signed)
Pt set up on Cpap 5 CMH20 pt using his own mask.  Tolerating well no issues to report.

## 2015-05-22 NOTE — Progress Notes (Signed)
Notified Dr. Philbert Riser to clarify PO cardizem order. Cardizem gtt going at 15mg /hr.  Pt in afib/aflutter and asymptomatic resting in bed.   Will continue to monitor.   Earlie Lou

## 2015-05-22 NOTE — Plan of Care (Signed)
Problem: Activity: Goal: Ability to tolerate increased activity will improve Outcome: Not Progressing Pt scheduled for ablation in am

## 2015-05-22 NOTE — Progress Notes (Signed)
RN paged Dr.NcLean related to HR 116-130's. Patient already received 12.5mg  Metoprolol PO and Cardizem 180mg  PO.  M.D returned page and new orders given to restart Cardizem gtt and titrate as needed. RN will follow orders and continue to monitor.   Arnell Sieving, RN

## 2015-05-22 NOTE — Progress Notes (Signed)
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 Sallyanne Kuster tomorrow at [facility].   Booking# [id number]  Signed, Almyra Deforest PA Pager: [phone]
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
## 2015-05-23 ENCOUNTER — Inpatient Hospital Stay (HOSPITAL_COMMUNITY): Payer: BLUE CROSS/BLUE SHIELD

## 2015-05-23 ENCOUNTER — Inpatient Hospital Stay (HOSPITAL_COMMUNITY): Payer: BLUE CROSS/BLUE SHIELD | Admitting: Certified Registered Nurse Anesthetist

## 2015-05-23 ENCOUNTER — Encounter (HOSPITAL_COMMUNITY)
Admission: EM | Disposition: A | Discharge status: Home / Self Care | Payer: Self-pay | Source: Home / Self Care | Attending: Cardiology

## 2015-05-23 ENCOUNTER — Encounter (HOSPITAL_COMMUNITY): Payer: Self-pay

## 2015-05-23 DIAGNOSIS — I4892 Unspecified atrial flutter: Secondary | ICD-10-CM

## 2015-05-23 DIAGNOSIS — I340 Nonrheumatic mitral (valve) insufficiency: Secondary | ICD-10-CM

## 2015-05-23 HISTORY — PX: CARDIOVERSION: SHX1299

## 2015-05-23 HISTORY — PX: TEE WITHOUT CARDIOVERSION: SHX5443

## 2015-05-23 SURGERY — CARDIOVERSION
Anesthesia: Monitor Anesthesia Care

## 2015-05-23 MED ORDER — METOPROLOL TARTRATE 25 MG PO TABS: 25.0000 mg | ORAL_TABLET | Freq: Two times a day (BID) | ORAL | Status: DC

## 2015-05-23 MED ORDER — MIDAZOLAM HCL 2 MG/2ML IJ SOLN: 0.5000 mg | Freq: Once | INTRAMUSCULAR | Status: DC | PRN

## 2015-05-23 MED ORDER — FENTANYL CITRATE (PF) 100 MCG/2ML IJ SOLN: 25.0000 ug | INTRAMUSCULAR | Status: DC | PRN

## 2015-05-23 MED ORDER — MEPERIDINE HCL 25 MG/ML IJ SOLN: 6.2500 mg | INTRAMUSCULAR | Status: DC | PRN

## 2015-05-23 MED ORDER — PROMETHAZINE HCL 25 MG/ML IJ SOLN: 6.2500 mg | INTRAMUSCULAR | Status: DC | PRN

## 2015-05-23 MED ORDER — PROPOFOL 500 MG/50ML IV EMUL
INTRAVENOUS | Status: DC | PRN
  Administered 2015-05-23: 100 ug/kg/min via INTRAVENOUS

## 2015-05-23 NOTE — Anesthesia Postprocedure Evaluation (Signed)
Anesthesia Post Note  Patient: Jon [name]  Procedure(s) Performed: Procedure(s) (LRB): CARDIOVERSION (N/A) TRANSESOPHAGEAL ECHOCARDIOGRAM (TEE) (N/A)  Patient location during evaluation: Endoscopy Anesthesia Type: MAC Level of consciousness: awake and alert and oriented Vital Signs Assessment: post-procedure vital signs reviewed and stable Respiratory status: spontaneous breathing and respiratory function stable Cardiovascular status: blood pressure returned to baseline and stable Anesthetic complications: no    Last Vitals:  Filed Vitals:   05/23/15 0616 05/23/15 1246  BP: 121/64 124/68  Pulse: 46 94  Temp:    Resp: 16 16    Last Pain:  Filed Vitals:   05/23/15 1248  PainSc: 0-No pain                 Clearnce Sorrel

## 2015-05-23 NOTE — Anesthesia Preprocedure Evaluation (Addendum)
Anesthesia Evaluation  Patient identified by MRN, date of birth, ID band Patient awake    Reviewed: Allergy & Precautions, NPO status , Patient's Chart, lab work & pertinent test results  History of Anesthesia Complications Negative for: history of anesthetic complications  Airway Mallampati: I  TM Distance: >3 FB Neck ROM: Full    Dental  (+) Dental Advisory Given   Pulmonary sleep apnea and Continuous Positive Airway Pressure Ventilation ,    breath sounds clear to auscultation       Cardiovascular (-) angina+ dysrhythmias Atrial Fibrillation  Rhythm:Irregular Rate:Tachycardia  '21 ECHO: EF 55-60%, valves OK   Neuro/Psych negative neurological ROS     GI/Hepatic negative GI ROS, Neg liver ROS,   Endo/Other  negative endocrine ROS  Renal/GU negative Renal ROS     Musculoskeletal   Abdominal   Peds  Hematology  (+) Blood dyscrasia (Xarelto), ,   Anesthesia Other Findings   Reproductive/Obstetrics                           Anesthesia Physical Anesthesia Plan  ASA: III  Anesthesia Plan: MAC   Post-op Pain Management:    Induction: Intravenous  Airway Management Planned: Nasal Cannula, Natural Airway and Mask  Additional Equipment:   Intra-op Plan:   Post-operative Plan:   Informed Consent: I have reviewed the patients History and Physical, chart, labs and discussed the procedure including the risks, benefits and alternatives for the proposed anesthesia with the patient or authorized representative who has indicated his/her understanding and acceptance.   Dental advisory given  Plan Discussed with: CRNA and Surgeon  Anesthesia Plan Comments: (Plan routine monitors, MAC)        Anesthesia Quick Evaluation

## 2015-05-23 NOTE — Progress Notes (Signed)
  Echocardiogram Echocardiogram Transesophageal has been performed.  Jon [name] M 05/23/2015, 2:15 PM

## 2015-05-23 NOTE — CV Procedure (Signed)
INDICATIONS: atrial flutter precardioversion  PROCEDURE:   Informed consent was obtained prior to the procedure. The risks, benefits and alternatives for the procedure were discussed and the patient comprehended these risks.  Risks include, but are not limited to, cough, sore throat, vomiting, nausea, somnolence, esophageal and stomach trauma or perforation, bleeding, low blood pressure, aspiration, pneumonia, infection, trauma to the teeth and death.    After a procedural time-out, the oropharynx was anesthetized with 20% benzocaine spray. The patient was given IV propofol for moderate sedation.   The transesophageal probe was inserted in the esophagus and stomach without difficulty and multiple views were obtained.  The patient was kept under observation until the patient left the procedure room.  The patient left the procedure room in stable condition.   Agitated microbubble saline contrast was not administered.  COMPLICATIONS:    There were no immediate complications.  FINDINGS:   Large multilobulated left atrial appendage without clot and with good emptying velocities. Normal LV function. Mild MR. No other abnormalities,.  RECOMMENDATIONS:   Proceed with cardioversion.  Time Spent Directly with the Patient:  30 minutes   Jon [name] 05/23/2015, 1:52 PM

## 2015-05-23 NOTE — Transfer of Care (Signed)
Immediate Anesthesia Transfer of Care Note  Patient: Jon [name]  Procedure(s) Performed: Procedure(s): CARDIOVERSION (N/A) TRANSESOPHAGEAL ECHOCARDIOGRAM (TEE) (N/A)  Patient Location: Endoscopy Unit  Anesthesia Type:General  Level of Consciousness: awake, alert  and oriented  Airway & Oxygen Therapy: Patient Spontanous Breathing and Patient connected to nasal cannula oxygen  Post-op Assessment: Report given to RN and Post -op Vital signs reviewed and stable  Post vital signs: Reviewed and stable  Last Vitals:  Filed Vitals:   05/23/15 0616 05/23/15 1246  BP: 121/64 124/68  Pulse: 46 94  Temp:    Resp: 16 16    Complications: No apparent anesthesia complications
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
## 2015-05-23 NOTE — Discharge Instructions (Signed)
Atrial Flutter °Atrial flutter is a heart rhythm that can cause the heart to beat very fast (tachycardia). It originates in the upper chambers of the heart (atria). In atrial flutter, the top chambers of the heart (atria) often beat much faster than the bottom chambers of the heart (ventricles). Atrial flutter has a regular "saw toothed" appearance in an EKG readout. An EKG is a test that records the electrical activity of the heart. Atrial flutter can cause the heart to beat up to 150 beats per minute (BPM). Atrial flutter can either be short lived (paroxysmal) or permanent.  °CAUSES  °Causes of atrial flutter can be many. Some of these include: °· Heart related issues: °¨ Heart attack (myocardial infarction). °¨ Heart failure. °¨ Heart valve problems. °¨ Poorly controlled high blood pressure (hypertension). °¨ After open heart surgery. °· Lung related issues: °¨ A blood clot in the lungs (pulmonary embolism). °¨ Chronic obstructive pulmonary disease (COPD). Medications used to treat COPD can attribute to atrial flutter. °· Other related causes: °¨ Hyperthyroidism. °¨ Caffeine. °¨ Some decongestant cold medications. °¨ Low electrolyte levels such as potassium or magnesium. °¨ Cocaine. °SYMPTOMS °· An awareness of your heart beating rapidly (palpitations). °· Shortness of breath. °· Chest pain. °· Low blood pressure (hypotension). °· Dizziness or fainting. °DIAGNOSIS  °Different tests can be performed to diagnose atrial flutter.  °· An EKG. °· Holter monitor. This is a 24-hour recording of your heart rhythm. You will also be given a diary. Write down all symptoms that you have and what you were doing at the time you experienced symptoms. °· Cardiac event monitor. This small device can be worn for up to 30 days. When you have heart symptoms, you will push a button on the device. This will then record your heart rhythm. °· Echocardiogram. This is an imaging test to look at your heart. Your caregiver will look at your  heart valves and the ventricles. °· Stress test. This test can help determine if the atrial flutter is related to exercise or if coronary artery disease is present. °· Laboratory studies will look at certain blood levels like: °¨ Complete blood count (CBC). °¨ Potassium. °¨ Magnesium. °¨ Thyroid function. °TREATMENT  °Treatment of atrial flutter varies. A combination of therapies may be used or sometimes atrial flutter may need only 1 type of treatment.  °Lab work: °If your blood work, such as your electrolytes (potassium, magnesium) or your thyroid function tests, are abnormal, your caregiver will treat them accordingly.  °Medication:  °There are several different types of medications that can convert your heart to a normal rhythm and prevent atrial flutter from reoccurring.  °Nonsurgical procedures: °Nonsurgical techniques may be used to control atrial flutter. Some examples include: °· Cardioversion. This technique uses either drugs or an electrical shock to restore a normal heart rhythm: °¨ Cardioversion drugs may be given through an intravenous (IV) line to help "reset" the heart rhythm. °¨ In electrical cardioversion, your caregiver shocks your heart with electrical energy. This helps to reset the heartbeat to a normal rhythm. °· Ablation. If atrial flutter is a persistent problem, an ablation may be needed. This procedure is done under mild sedation. High frequency radio-wave energy is used to destroy the area of heart tissue responsible for atrial flutter. °SEEK IMMEDIATE MEDICAL CARE IF:  °You have: °· Dizziness. °· Near fainting or fainting. °· Shortness of breath. °· Chest pain or pressure. °· Sudden nausea or vomiting. °· Profuse sweating. °If you have the above symptoms,   call your local emergency service immediately! Do not drive yourself to the hospital. °MAKE SURE YOU:  °· Understand these instructions. °· Will watch your condition. °· Will get help right away if you are not doing well or get worse. °   °This information is not intended to replace advice given to you by your health care provider. Make sure you discuss any questions you have with your health care provider. °  °Document Released: 09/20/2008 Document Revised: 05/25/2014 Document Reviewed: 11/16/2014 °Elsevier Interactive Patient Education ©2016 [employer] ° °

## 2015-05-23 NOTE — Interval H&P Note (Signed)
History and Physical Interval Note:  05/23/2015 10:41 AM  Jon [name]  has presented today for surgery, with the diagnosis of aflutter  The various methods of treatment have been discussed with the patient and family. After consideration of risks, benefits and other options for treatment, the patient has consented to  Procedure(s): CARDIOVERSION (N/A) TRANSESOPHAGEAL ECHOCARDIOGRAM (TEE) (N/A) as a surgical intervention .  The patient's history has been reviewed, patient examined, no change in status, stable for surgery.  I have reviewed the patient's chart and labs.  Questions were answered to the patient's satisfaction.     [name]
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
## 2015-05-23 NOTE — Op Note (Signed)
Procedure: Electrical Cardioversion Indications:  Atrial Flutter  Procedure Details:  Consent: Risks of procedure as well as the alternatives and risks of each were explained to the (patient/caregiver).  Consent for procedure obtained.  Time Out: Verified patient identification, verified procedure, site/side was marked, verified correct patient position, special equipment/implants available, medications/allergies/relevent history reviewed, required imaging and test results available.  Performed  Patient placed on cardiac monitor, pulse oximetry, supplemental oxygen as necessary.  Sedation given: IV Propofol, Dr. Glennon Mac, anesthesiology Pacer pads placed anterior and posterior chest.  Cardioverted 1 time(s).  Cardioversion with synchronized biphasic 120J shock.  Evaluation: Findings: Post procedure EKG shows: Atrial Flutter Complications: None Patient did tolerate procedure well.  Time Spent Directly with the Patient:  60 minutes   Jon [name] 05/23/2015, 1:50 PM

## 2015-05-24 ENCOUNTER — Encounter (HOSPITAL_COMMUNITY): Payer: Self-pay | Admitting: Cardiovascular Disease

## 2015-05-24 ENCOUNTER — Ambulatory Visit (INDEPENDENT_AMBULATORY_CARE_PROVIDER_SITE_OTHER): Payer: BLUE CROSS/BLUE SHIELD | Admitting: Cardiology

## 2015-05-24 ENCOUNTER — Encounter: Payer: Self-pay | Admitting: *Deleted

## 2015-05-24 ENCOUNTER — Encounter: Payer: Self-pay | Admitting: Cardiology

## 2015-05-24 VITALS — BP 113/92 | HR 66 | Ht 73.0 in | Wt 230.8 lb

## 2015-05-24 DIAGNOSIS — Z01812 Encounter for preprocedural laboratory examination: Secondary | ICD-10-CM

## 2015-05-24 DIAGNOSIS — I483 Typical atrial flutter: Secondary | ICD-10-CM | POA: Diagnosis not present
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
## 2015-05-24 NOTE — Patient Instructions (Signed)
Medication Instructions:  Your physician recommends that you continue on your current medications as directed. Please refer to the Current Medication list given to you today.  Labwork: Your physician recommends that you return for pre procedure lab work on:  Week of January 16-20th   Testing/Procedures: Your physician has recommended that you have an ablation. Catheter ablation is a medical procedure used to treat some cardiac arrhythmias (irregular heartbeats). During catheter ablation, a long, thin, flexible tube is put into a blood vessel in your groin (upper thigh), or neck. This tube is called an ablation catheter. It is then guided to your heart through the blood vessel. Radio frequency waves destroy small areas of heart tissue where abnormal heartbeats may cause an arrhythmia to start. Please see the instruction sheet given to you today.  Follow-Up: Your physician recommends that you schedule a follow-up appointment in: 4 weeks, after ablation on 06/14/15, with Amber/Renee.  Your physician recommends that you schedule a follow-up appointment in: 3 months, after ablation on 06/14/15, with Dr. Curt Bears.  If you need a refill on your cardiac medications before your next appointment, please call your pharmacy.  Thank you for choosing [facility]!!   Trinidad Curet, RN [phone]   Any Other Special Instructions Will Be Listed Below (If Applicable).  Cardiac Ablation Cardiac ablation is a procedure to disable a small amount of heart tissue in very specific places. The heart has many electrical connections. Sometimes these connections are abnormal and can cause the heart to beat very fast or irregularly. By disabling some of the problem areas, heart rhythm can be improved or made normal. Ablation is done for people who:   Have Wolff-Parkinson-White syndrome.   Have other fast heart rhythms (tachycardia).   Have taken medicines for an abnormal heart rhythm (arrhythmia) that  resulted in:   No success.   Side effects.   May have a high-risk heartbeat that could result in death.  LET [facility] CARE PROVIDER KNOW ABOUT:   Any allergies you have or any previous reactions you have had to X-ray dye, food (such as seafood), medicine, or tape.   All medicines you are taking, including vitamins, herbs, eye drops, creams, and over-the-counter medicines.   Previous problems you or members of your family have had with the use of anesthetics.   Any blood disorders you have.   Previous surgeries or procedures (such as a kidney transplant) you have had.   Medical conditions you have (such as kidney failure).  RISKS AND COMPLICATIONS Generally, cardiac ablation is a safe procedure. However, problems can occur and include:   Increased risk of cancer. Depending on how long it takes to do the ablation, the dose of radiation can be high.  Bruising and bleeding where a thin, flexible tube (catheter) was inserted during the procedure.   Bleeding into the chest, especially into the sac that surrounds the heart (serious).  Need for a permanent pacemaker if the normal electrical system is damaged.   The procedure may not be fully effective, and this may not be recognized for months. Repeat ablation procedures are sometimes required. BEFORE THE PROCEDURE   Follow any instructions from your health care provider regarding eating and drinking before the procedure.   Take your medicines as directed at regular times with water, unless instructed otherwise by your health care provider. If you are taking diabetes medicine, including insulin, ask how you are to take it and if there are any special instructions you should follow. It is  common to adjust insulin dosing the day of the ablation.  PROCEDURE  An ablation is usually performed in a catheterization laboratory with the guidance of fluoroscopy. Fluoroscopy is a type of X-ray that helps your health care  provider see images of your heart during the procedure.   An ablation is a minimally invasive procedure. This means a small cut (incision) is made in either your neck or groin. Your health care provider will decide where to make the incision based on your medical history and physical exam.  An IV tube will be started before the procedure begins. You will be given an anesthetic or medicine to help you relax (sedative).  The skin on your neck or groin will be numbed. A needle will be inserted into a large vein in your neck or groin and catheters will be threaded to your heart.  A special dye that shows up on fluoroscopy pictures may be injected through the catheter. The dye helps your health care provider see the area of the heart that needs treatment.  The catheter has electrodes on the tip. When the area of heart tissue that is causing the arrhythmia is found, the catheter tip will send an electrical current to the area and "scar" the tissue. Three types of energy can be used to ablate the heart tissue:   Heat (radiofrequency energy).   Laser energy.   Extreme cold (cryoablation).   When the area of the heart has been ablated, the catheter will be taken out. Pressure will be held on the insertion site. This will help the insertion site clot and keep it from bleeding. A bandage will be placed on the insertion site.  AFTER THE PROCEDURE   After the procedure, you will be taken to a recovery area where your vital signs (blood pressure, heart rate, and breathing) will be monitored. The insertion site will also be monitored for bleeding.   You will need to lie still for 4-6 hours. This is to ensure you do not bleed from the catheter insertion site.    This information is not intended to replace advice given to you by your health care provider. Make sure you discuss any questions you have with your health care provider.   Document Released: 09/20/2008 Document Revised: 05/25/2014  Document Reviewed: 09/26/2012 Elsevier Interactive Patient Education [employer].

## 2015-05-27 ENCOUNTER — Telehealth: Payer: Self-pay | Admitting: Cardiology

## 2015-05-27 NOTE — Telephone Encounter (Signed)
D/C phone Call. Appt is on 07/12/15 at 8am w/Renee Charlcie Cradle at [facility]..   Thanks

## 2015-05-28 NOTE — Telephone Encounter (Signed)
Pt was seen by dr Curt Bears on 05-24-15.

## 2015-05-29 NOTE — Addendum Note (Signed)
Addended by: Freada Bergeron on: 05/29/2015 04:31 PM   Modules accepted: Orders

## 2015-05-30 NOTE — Addendum Note (Signed)
Addended by: Therisa Doyne on: 05/30/2015 08:00 AM   Modules accepted: Orders

## 2015-06-03 ENCOUNTER — Other Ambulatory Visit (INDEPENDENT_AMBULATORY_CARE_PROVIDER_SITE_OTHER): Payer: BLUE CROSS/BLUE SHIELD | Admitting: *Deleted

## 2015-06-03 DIAGNOSIS — I483 Typical atrial flutter: Secondary | ICD-10-CM | POA: Diagnosis not present

## 2015-06-03 DIAGNOSIS — Z01812 Encounter for preprocedural laboratory examination: Secondary | ICD-10-CM | POA: Diagnosis not present

## 2015-06-03 LAB — CBC WITH DIFFERENTIAL/PLATELET
BASOS PCT: 0 % (ref 0–1)
Basophils Absolute: 0 10*3/uL (ref 0.0–0.1)
EOS ABS: 0.3 10*3/uL (ref 0.0–0.7)
Eosinophils Relative: 5 % (ref 0–5)
HCT: 42.4 % (ref 39.0–52.0)
HEMOGLOBIN: 14.5 g/dL (ref 13.0–17.0)
Lymphocytes Relative: 28 % (ref 12–46)
Lymphs Abs: 1.5 10*3/uL (ref 0.7–4.0)
MCH: 31.7 pg (ref 26.0–34.0)
MCHC: 34.2 g/dL (ref 30.0–36.0)
MCV: 92.8 fL (ref 78.0–100.0)
MONO ABS: 0.4 10*3/uL (ref 0.1–1.0)
MPV: 9.9 fL (ref 8.6–12.4)
Monocytes Relative: 7 % (ref 3–12)
NEUTROS ABS: 3.3 10*3/uL (ref 1.7–7.7)
NEUTROS PCT: 60 % (ref 43–77)
PLATELETS: 219 10*3/uL (ref 150–400)
RBC: 4.57 MIL/uL (ref 4.22–5.81)
RDW: 12.9 % (ref 11.5–15.5)
WBC: 5.5 10*3/uL (ref 4.0–10.5)

## 2015-06-03 LAB — BASIC METABOLIC PANEL
BUN: 13 mg/dL (ref 7–25)
CALCIUM: 8.9 mg/dL (ref 8.6–10.3)
CO2: 26 mmol/L (ref 20–31)
Chloride: 106 mmol/L (ref 98–110)
Creat: 0.82 mg/dL (ref 0.60–1.35)
GLUCOSE: 94 mg/dL (ref 65–99)
Potassium: 4.4 mmol/L (ref 3.5–5.3)
SODIUM: 142 mmol/L (ref 135–146)

## 2015-06-03 NOTE — Addendum Note (Signed)
Addended by: Eulis Foster on: 06/03/2015 08:49 AM   Modules accepted: Orders

## 2015-06-06 ENCOUNTER — Telehealth: Payer: Self-pay | Admitting: Cardiology

## 2015-06-06 NOTE — Telephone Encounter (Signed)
I emphasized importance of continuing Xarelto prior to ablation.  Pt advised I will forward to Dr Curt Bears for review.

## 2015-06-06 NOTE — Telephone Encounter (Signed)
Pt states he had a spontaneous nose bleed this morning while he was getting in his truck.  Pt states the nose bleed  lasted about 3 minutes, filled about 3/4 of a kleenex. Pt states he has had some nasal congestion recently.   Pt advised to avoid irritating nasal passages, use saline nasal spray prn, call if nose bleed returns.

## 2015-06-06 NOTE — Telephone Encounter (Signed)
New message      Pt is due to have an ablation next Friday.  He is on xarelto and had a bad nosebleed this am.  Please call

## 2015-06-07 NOTE — Telephone Encounter (Signed)
Ok to speak to wife, per husband. Called to check in on patient.  No nose bleed recurrence. Patient with recent congestion and possible irritation to nasal passages. Discussed continuing medication and monitor for any bleeding.  Eased pt's wife mind by explaining that we would not be concerned unless pt begin to have recurrent bleeding issues. Advised to call office if bleeding reoccurs. Patient's wife verbalized understanding and agreeable to plan.

## 2015-06-14 ENCOUNTER — Encounter (HOSPITAL_COMMUNITY): Payer: Self-pay | Admitting: General Practice

## 2015-06-14 ENCOUNTER — Ambulatory Visit (HOSPITAL_COMMUNITY)
Admission: RE | Admit: 2015-06-14 | Discharge: 2015-06-15 | Disposition: A | Discharge status: Home / Self Care | Payer: BLUE CROSS/BLUE SHIELD | Source: Ambulatory Visit | Attending: Cardiology | Admitting: Cardiology

## 2015-06-14 ENCOUNTER — Encounter (HOSPITAL_COMMUNITY)
Admission: RE | Disposition: A | Discharge status: Home / Self Care | Payer: BLUE CROSS/BLUE SHIELD | Source: Ambulatory Visit | Attending: Cardiology

## 2015-06-14 DIAGNOSIS — E785 Hyperlipidemia, unspecified: Secondary | ICD-10-CM | POA: Diagnosis not present

## 2015-06-14 DIAGNOSIS — Z79899 Other long term (current) drug therapy: Secondary | ICD-10-CM | POA: Insufficient documentation

## 2015-06-14 DIAGNOSIS — I480 Paroxysmal atrial fibrillation: Secondary | ICD-10-CM | POA: Diagnosis not present

## 2015-06-14 DIAGNOSIS — Z7901 Long term (current) use of anticoagulants: Secondary | ICD-10-CM | POA: Diagnosis not present

## 2015-06-14 DIAGNOSIS — I4892 Unspecified atrial flutter: Secondary | ICD-10-CM | POA: Diagnosis present

## 2015-06-14 DIAGNOSIS — I483 Typical atrial flutter: Secondary | ICD-10-CM | POA: Diagnosis not present

## 2015-06-14 DIAGNOSIS — Z89511 Acquired absence of right leg below knee: Secondary | ICD-10-CM | POA: Diagnosis not present

## 2015-06-14 HISTORY — DX: Obstructive sleep apnea (adult) (pediatric): G47.33

## 2015-06-14 HISTORY — PX: ATRIAL FLUTTER ABLATION: SHX5733

## 2015-06-14 HISTORY — PX: ELECTROPHYSIOLOGIC STUDY: SHX172A

## 2015-06-14 HISTORY — DX: Dependence on other enabling machines and devices: Z99.89

## 2015-06-14 SURGERY — A-FLUTTER/A-TACH/SVT ABLATION
Anesthesia: LOCAL

## 2015-06-14 MED ORDER — MIDAZOLAM HCL 5 MG/5ML IJ SOLN
INTRAMUSCULAR | Status: AC
  Filled 2015-06-14: qty 5

## 2015-06-14 MED ORDER — BUPIVACAINE HCL (PF) 0.25 % IJ SOLN
INTRAMUSCULAR | Status: AC
  Filled 2015-06-14: qty 30

## 2015-06-14 MED ORDER — ADULT MULTIVITAMIN W/MINERALS CH
1.0000 | ORAL_TABLET | Freq: Every day | ORAL | Status: DC
Start: 2015-06-15 — End: 2015-06-15

## 2015-06-14 MED ORDER — RIVAROXABAN 20 MG PO TABS
20.0000 mg | ORAL_TABLET | Freq: Every day | ORAL | Status: DC
  Administered 2015-06-14: 22:00:00 20 mg via ORAL
  Filled 2015-06-14: qty 1

## 2015-06-14 MED ORDER — SODIUM CHLORIDE 0.9% FLUSH: 3.0000 mL | INTRAVENOUS | Status: DC | PRN

## 2015-06-14 MED ORDER — MAGNESIUM 500 MG PO TABS: 1.0000 | ORAL_TABLET | Freq: Every day | ORAL | Status: DC

## 2015-06-14 MED ORDER — ACETAMINOPHEN 325 MG PO TABS: 650.0000 mg | ORAL_TABLET | ORAL | Status: DC | PRN

## 2015-06-14 MED ORDER — FENTANYL CITRATE (PF) 100 MCG/2ML IJ SOLN
INTRAMUSCULAR | Status: AC
  Filled 2015-06-14: qty 2

## 2015-06-14 MED ORDER — FENTANYL CITRATE (PF) 100 MCG/2ML IJ SOLN
INTRAMUSCULAR | Status: DC | PRN
  Administered 2015-06-14 (×8): 25 ug via INTRAVENOUS

## 2015-06-14 MED ORDER — OFF THE BEAT BOOK
Freq: Once | Status: AC
  Administered 2015-06-14: 17:00:00
  Filled 2015-06-14: qty 1

## 2015-06-14 MED ORDER — SODIUM CHLORIDE 0.9 % IV SOLN: 250.0000 mL | INTRAVENOUS | Status: DC | PRN

## 2015-06-14 MED ORDER — METOPROLOL TARTRATE 25 MG PO TABS
25.0000 mg | ORAL_TABLET | Freq: Two times a day (BID) | ORAL | Status: DC
  Administered 2015-06-14 – 2015-06-15 (×2): 25 mg via ORAL
  Filled 2015-06-14 (×2): qty 1

## 2015-06-14 MED ORDER — SODIUM CHLORIDE 0.9 % IV SOLN
INTRAVENOUS | Status: DC | PRN
  Administered 2015-06-14: 50 mL/h via INTRAVENOUS

## 2015-06-14 MED ORDER — HEPARIN (PORCINE) IN NACL 2-0.9 UNIT/ML-% IJ SOLN
INTRAMUSCULAR | Status: AC
  Filled 2015-06-14: qty 500

## 2015-06-14 MED ORDER — MIDAZOLAM HCL 5 MG/5ML IJ SOLN
INTRAMUSCULAR | Status: DC | PRN
  Administered 2015-06-14 (×10): 1 mg via INTRAVENOUS

## 2015-06-14 MED ORDER — ONDANSETRON HCL 4 MG/2ML IJ SOLN: 4.0000 mg | Freq: Four times a day (QID) | INTRAMUSCULAR | Status: DC | PRN

## 2015-06-14 MED ORDER — BUPIVACAINE HCL (PF) 0.25 % IJ SOLN
INTRAMUSCULAR | Status: DC | PRN
  Administered 2015-06-14: 60 mL

## 2015-06-14 MED ORDER — SODIUM CHLORIDE 0.9% FLUSH
3.0000 mL | Freq: Two times a day (BID) | INTRAVENOUS | Status: DC
  Administered 2015-06-14: 22:00:00 3 mL via INTRAVENOUS

## 2015-06-14 MED ORDER — MAGNESIUM OXIDE 400 (241.3 MG) MG PO TABS
400.0000 mg | ORAL_TABLET | Freq: Every day | ORAL | Status: DC
  Filled 2015-06-14: qty 1

## 2015-06-14 SURGICAL SUPPLY — 13 items
BAG SNAP BAND KOVER 36X36 (MISCELLANEOUS) ×2 IMPLANT
CATH DUODECA HALO/ISMUS 7FR (CATHETERS) ×2 IMPLANT
CATH EZ STEER NAV 8MM F-J CUR (ABLATOR) ×2 IMPLANT
CATH JOSEPHSON QUAD-ALLRED 6FR (CATHETERS) ×4 IMPLANT
CATH WEBSTER BI DIR CS D-F CRV (CATHETERS) ×2 IMPLANT
PACK EP LATEX FREE (CUSTOM PROCEDURE TRAY) ×1
PACK EP LF (CUSTOM PROCEDURE TRAY) ×1 IMPLANT
PAD DEFIB LIFELINK (PAD) ×2 IMPLANT
PATCH CARTO3 (PAD) ×2 IMPLANT
SHEATH PINNACLE 6F 10CM (SHEATH) ×2 IMPLANT
SHEATH PINNACLE 7F 10CM (SHEATH) ×4 IMPLANT
SHEATH PINNACLE 8F 10CM (SHEATH) ×2 IMPLANT
SHIELD RADPAD SCOOP 12X17 (MISCELLANEOUS) ×2 IMPLANT

## 2015-06-14 NOTE — Progress Notes (Signed)
Site area: left groin a 6 and 8 french venous sheaths were removed  Site Prior to Removal:  Level 0  Pressure Applied For 15 MINUTES    Minutes Beginning at 1600  Manual:   Yes.    Patient Status During Pull:  stable  Post Pull Groin Site:  Level 0  Post Pull Instructions Given:  Yes.    Post Pull Pulses Present:  Yes.    Dressing Applied:  Yes.    Comments:  VS remain stable during sheath pull
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
## 2015-06-14 NOTE — Progress Notes (Signed)
Site area:right groin a  7 and 8 french venous sheath was removed  Site Prior to Removal:  Level 0  Pressure Applied For 15 MINUTES    Minutes Beginning at 1620p  Manual:   Yes.    Patient Status During Pull:  stable  Post Pull Groin Site:  Level 0  Post Pull Instructions Given:  Yes.    Post Pull Pulses Present:  Yes.    Dressing Applied:  Yes.    Comments:  VS remain stable during sheath pull

## 2015-06-15 DIAGNOSIS — Z89511 Acquired absence of right leg below knee: Secondary | ICD-10-CM | POA: Diagnosis not present

## 2015-06-15 DIAGNOSIS — I480 Paroxysmal atrial fibrillation: Secondary | ICD-10-CM | POA: Diagnosis not present

## 2015-06-15 DIAGNOSIS — I4892 Unspecified atrial flutter: Secondary | ICD-10-CM | POA: Diagnosis not present

## 2015-06-15 DIAGNOSIS — I483 Typical atrial flutter: Secondary | ICD-10-CM | POA: Diagnosis not present

## 2015-06-15 DIAGNOSIS — E785 Hyperlipidemia, unspecified: Secondary | ICD-10-CM | POA: Diagnosis not present

## 2015-06-15 MED ORDER — RIVAROXABAN 20 MG PO TABS: 20.0000 mg | ORAL_TABLET | Freq: Every day | ORAL | Status: DC

## 2015-06-15 NOTE — Progress Notes (Signed)
Patient ID: Jon [name], male   DOB: [birth year], 50 y.o.   MRN: [id number]    Patient Name: Jon [name] Date of Encounter: 06/15/2015     Active Problems:   Atrial flutter ([place])    SUBJECTIVE  No chest pain or sob, s/p catheter ablation atrial flutter  CURRENT MEDS . magnesium oxide  400 mg Oral Daily  . metoprolol tartrate  25 mg Oral BID  . multivitamin with minerals  1 tablet Oral Daily  . rivaroxaban  20 mg Oral Q supper  . sodium chloride flush  3 mL Intravenous Q12H    OBJECTIVE  Filed Vitals:   06/14/15 1933 06/14/15 1940 06/14/15 2001 06/15/15 0650  BP: 121/74  117/70 130/80  Pulse: 88 84 84 74  Temp: 97.7 F (36.5 C)   97.4 F (36.3 C)  TempSrc: Oral   Oral  Resp: 16 17 18 19   Height:      Weight:      SpO2: 97% 96% 95% 98%    Intake/Output Summary (Last 24 hours) at 06/15/15 0821 Last data filed at 06/15/15 0720  Gross per 24 hour  Intake    920 ml  Output    900 ml  Net     20 ml   Filed Weights   06/14/15 0943  Weight: 225 lb (102.059 kg)    PHYSICAL EXAM  General: Pleasant, NAD. Neuro: Alert and oriented X 3. Moves all extremities spontaneously. Psych: Normal affect. HEENT:  Normal  Neck: Supple without bruits or JVD. Lungs:  Resp regular and unlabored, CTA. Heart: RRR no s3, s4, or murmurs. Abdomen: Soft, non-tender, non-distended, BS + x 4.  Extremities: No clubbing, cyanosis or edema. DP/PT/Radials 2+ and equal bilaterally.  Accessory Clinical Findings  CBC No results for input(s): WBC, NEUTROABS, HGB, HCT, MCV, PLT in the last 72 hours. Basic Metabolic Panel No results for input(s): NA, K, CL, CO2, GLUCOSE, BUN, CREATININE, CALCIUM, MG, PHOS in the last 72 hours. Liver Function Tests No results for input(s): AST, ALT, ALKPHOS, BILITOT, PROT, ALBUMIN in the last 72 hours. No results for input(s): LIPASE, AMYLASE in the last 72 hours. Cardiac Enzymes No results for input(s): CKTOTAL, CKMB, CKMBINDEX, TROPONINI in the last  72 hours. BNP Invalid input(s): POCBNP D-Dimer No results for input(s): DDIMER in the last 72 hours. Hemoglobin A1C No results for input(s): HGBA1C in the last 72 hours. Fasting Lipid Panel No results for input(s): CHOL, HDL, LDLCALC, TRIG, CHOLHDL, LDLDIRECT in the last 72 hours. Thyroid Function Tests No results for input(s): TSH, T4TOTAL, T3FREE, THYROIDAB in the last 72 hours.  Invalid input(s): FREET3  TELE  nsr  ECG  nsr  Radiology/Studies  Dg Chest Port 1 View  05/21/2015  CLINICAL DATA:  Cardiac palpitations EXAM: PORTABLE CHEST 1 VIEW COMPARISON:  None. FINDINGS: Lungs are clear. Heart is borderline prominent with pulmonary vascularity within normal limits. No adenopathy. No bone lesions. IMPRESSION: Mild cardiac prominence.  No edema or consolidation. Electronically Signed   By: Lowella Grip III M.D.   On: 05/21/2015 07:02    ASSESSMENT AND PLAN  1. Atrial flutter 2. S/p catheter ablation Rec: ok for discharge. Patient states he was told he could go back to work in a week. Continue anti-coagulation. F/U with Dr. Carlyn Reichert in 3-4 weeks.   Odessie Polzin,M.D.  06/15/2015 8:21 AM
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
## 2015-06-17 ENCOUNTER — Encounter (HOSPITAL_COMMUNITY): Payer: Self-pay | Admitting: Cardiology

## 2015-06-17 MED FILL — Heparin Sodium (Porcine) 2 Unit/ML in Sodium Chloride 0.9%: INTRAMUSCULAR | Qty: 500 | Status: AC

## 2015-07-12 ENCOUNTER — Ambulatory Visit: Payer: BLUE CROSS/BLUE SHIELD | Admitting: Physician Assistant
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
## 2015-07-15 ENCOUNTER — Ambulatory Visit (INDEPENDENT_AMBULATORY_CARE_PROVIDER_SITE_OTHER): Payer: BLUE CROSS/BLUE SHIELD | Admitting: Cardiology

## 2015-07-15 ENCOUNTER — Encounter: Payer: Self-pay | Admitting: Cardiology

## 2015-07-15 VITALS — BP 118/74 | HR 66 | Ht 73.0 in | Wt 230.2 lb

## 2015-07-15 DIAGNOSIS — Z8679 Personal history of other diseases of the circulatory system: Secondary | ICD-10-CM | POA: Diagnosis not present

## 2015-07-15 DIAGNOSIS — Z9889 Other specified postprocedural states: Secondary | ICD-10-CM | POA: Diagnosis not present

## 2015-07-15 DIAGNOSIS — I483 Typical atrial flutter: Secondary | ICD-10-CM

## 2015-07-15 NOTE — Patient Instructions (Signed)
Medication Instructions:  Your physician has recommended you make the following change in your medication:  1) STOP Xarelto  Labwork: None ordered  Testing/Procedures: None ordered  Follow-Up: No follow up is needed at this time with Dr. Curt Bears.  He will see you on an as needed basis.  If you need a refill on your cardiac medications before your next appointment, please call your pharmacy.  Thank you for choosing [facility]!!   Trinidad Curet, RN [phone]

## 2015-07-17 ENCOUNTER — Ambulatory Visit (HOSPITAL_COMMUNITY)
Admission: RE | Admit: 2015-07-17 | Discharge: 2015-07-17 | Disposition: A | Discharge status: Home / Self Care | Payer: BLUE CROSS/BLUE SHIELD | Source: Ambulatory Visit | Attending: Nurse Practitioner | Admitting: Nurse Practitioner

## 2015-07-17 ENCOUNTER — Encounter (HOSPITAL_COMMUNITY): Payer: Self-pay | Admitting: Nurse Practitioner

## 2015-07-17 ENCOUNTER — Telehealth: Payer: Self-pay | Admitting: Cardiology

## 2015-07-17 VITALS — BP 122/88 | HR 78 | Ht 73.0 in | Wt 229.2 lb

## 2015-07-17 DIAGNOSIS — E785 Hyperlipidemia, unspecified: Secondary | ICD-10-CM | POA: Insufficient documentation

## 2015-07-17 DIAGNOSIS — I4892 Unspecified atrial flutter: Secondary | ICD-10-CM | POA: Diagnosis not present

## 2015-07-17 DIAGNOSIS — R002 Palpitations: Secondary | ICD-10-CM | POA: Diagnosis not present

## 2015-07-17 DIAGNOSIS — Z89511 Acquired absence of right leg below knee: Secondary | ICD-10-CM | POA: Diagnosis not present

## 2015-07-17 DIAGNOSIS — Z8249 Family history of ischemic heart disease and other diseases of the circulatory system: Secondary | ICD-10-CM | POA: Diagnosis not present

## 2015-07-17 DIAGNOSIS — Z79899 Other long term (current) drug therapy: Secondary | ICD-10-CM | POA: Diagnosis not present

## 2015-07-17 DIAGNOSIS — G4733 Obstructive sleep apnea (adult) (pediatric): Secondary | ICD-10-CM | POA: Insufficient documentation

## 2015-07-17 NOTE — Telephone Encounter (Signed)
Patient calls in reporting AFib from 8am-7pm yesterday.  States that overnight went back out of rhythm. Back in rhythm around 7am. Patient states he did experience light-headedness yesterday. Patient has hx of PAF and underwent recent AFLutter ablation on 1/27. Appt made to see Roderic Palau, NP this morning to address concerns and perform EKG. Patient verbalized understanding and agreeable to plan.   (attempted several times to reach clinic and let them know patient placed on their schedule.

## 2015-07-17 NOTE — Progress Notes (Signed)
Patient ID: Jon [name], male   DOB: 15-Mar-1966, 50 y.o.   MRN: [id number]     Primary Care Physician: Crisoforo Oxford, PA-C Referring Physician: Dr. Harlin Heys Stuver is a 50 y.o. male with a h/o aflutter ablation 06/14/15 and now off xarelto for chadsvasc score of 0. He had irregular heart beat yesterday for 10 hours and asked to be seen in clinic  today. Now he is in rhythm, so it is hard to understand what he was feeling yesterday. He continues on metoprolol.  Today, he denies symptoms of palpitations, chest pain, shortness of breath, orthopnea, PND, lower extremity edema, dizziness, presyncope, syncope, or neurologic sequela. The patient is tolerating medications without difficulties and is otherwise without complaint today.   Past Medical History  Diagnosis Date  . Hyperlipidemia   . Fatigue     "due to the flutter" (06/14/2015)  . Paroxysmal a-fib ([place])   . Atrial flutter ([place])     Started on Xarelto 05/14/2015, s/p TEE DCCV on 1/5  . OSA on CPAP    Past Surgical History  Procedure Laterality Date  . Below knee leg amputation Right 2004  . Cardioversion N/A 05/23/2015    Procedure: CARDIOVERSION;  Surgeon: Sanda Klein, MD;  [facility];  Service: Cardiovascular;  Laterality: N/A;  . Tee without cardioversion N/A 05/23/2015    Procedure: TRANSESOPHAGEAL ECHOCARDIOGRAM (TEE);  Surgeon: Sanda Klein, MD;  Location: [facility] ENDOSCOPY;  Service: Cardiovascular;  Laterality: N/A;  . Atrial flutter ablation  06/14/2015  . Electrophysiologic study N/A 06/14/2015    Procedure: A-Flutter Ablation;  Surgeon: Will [name], MD;  Location: [place] CV LAB;  Service: Cardiovascular;  Laterality: N/A;    Current Outpatient Prescriptions  Medication Sig Dispense Refill  . ibuprofen (ADVIL,MOTRIN) 200 MG tablet Take 800 mg by mouth daily as needed for moderate pain.    . Magnesium 500 MG TABS Take 1 tablet by mouth daily.    . metoprolol tartrate (LOPRESSOR) 25  MG tablet Take 1 tablet (25 mg total) by mouth 2 (two) times daily. 180 tablet 3  . Multiple Vitamin (MULTIVITAMIN) tablet Take 1 tablet by mouth daily.      . Omega-3 Fatty Acids (FISH OIL PO) Take 2,400 mg by mouth daily.    . rivaroxaban (XARELTO) 20 MG TABS tablet Take 1 tablet (20 mg total) by mouth daily with supper. 30 tablet 5   No current facility-administered medications for this encounter.    No Known Allergies  Social History   Social History  . Marital Status: Married    Spouse Name: N/A  . Number of Children: 1  . Years of Education: N/A   Occupational History  . supervisor     [employer]   Social History Main Topics  . Smoking status: Never Smoker   . Smokeless tobacco: Never Used  . Alcohol Use: 13.2 oz/week    0 Standard drinks or equivalent, 22 Cans of beer per week     Comment: 06/14/2015 "2 beers X 5 days/week; 6 beers 2 days/wk"  . Drug Use: No  . Sexual Activity: Yes   Other Topics Concern  . Not on file   Social History Narrative   Married, 1 child, 24yo, exercises 3 times per week, eats healthy.   Works as Naval architect at [employer], Programmer, systems.     Family History  Problem Relation Age of Onset  . Heart disease Father 20    CABG  . Heart disease  Brother 49    died of MI  . Cancer Neg Hx   . Stroke Neg Hx   . Diabetes Neg Hx     ROS- All systems are reviewed and negative except as per the HPI above  Physical Exam: Filed Vitals:   07/17/15 1056  BP: 122/88  Pulse: 78  Height: 6\' 1"  (1.854 m)  Weight: 229 lb 3.2 oz (103.964 kg)    GEN- The patient is well appearing, alert and oriented x 3 today.   Head- normocephalic, atraumatic Eyes-  Sclera clear, conjunctiva pink Ears- hearing intact Oropharynx- clear Neck- supple, no JVP Lymph- no cervical lymphadenopathy Lungs- Clear to ausculation bilaterally, normal work of breathing Heart- Regular rate and rhythm, no murmurs, rubs or gallops, PMI not laterally  displaced GI- soft, NT, ND, + BS Extremities- no clubbing, cyanosis, or edema MS- no significant deformity or atrophy Skin- no rash or lesion Psych- euthymic mood, full affect Neuro- strength and sensation are intact  EKG- NSR, normal Ekg at 78 bpm, pr int 152 sec, qrs int 92 ms, qtc 424 ms Epic records reviewe  Assessment and Plan: 1. A flutter S/p ablation, off xarelto for chadsvasc score of 0 Continue metoprolol Next time pt has a prolonged episode of irregular heart beat, he is to call and come by to have EKG to document arrhythmia Otherwise, no change today  F/u as needed  Butch Penny C. Cinderella Christoffersen, [facility] [address] [place], [place] [address] [phone]

## 2015-07-17 NOTE — Telephone Encounter (Signed)
New Message:  Pt called in stating that yesterday he was in Afib for most of the day and he is concerned and would like to speak to the nurse. Please f/u with him

## 2015-07-25 ENCOUNTER — Ambulatory Visit: Admitting: Cardiology

## 2015-07-29 ENCOUNTER — Ambulatory Visit (HOSPITAL_COMMUNITY)
Admission: RE | Admit: 2015-07-29 | Discharge: 2015-07-29 | Disposition: A | Discharge status: Home / Self Care | Payer: BLUE CROSS/BLUE SHIELD | Source: Ambulatory Visit | Attending: Nurse Practitioner | Admitting: Nurse Practitioner

## 2015-07-29 NOTE — Progress Notes (Signed)
Pt here for EKG for palpitations  -- symptoms had improved once arrived therefore EKG was not performed. Jon Palau NP recommended holter monitor versus LINQ placement. Patient interested in [place] placement - will request appt with [place] be moved up from end of April. Pt understands scheduler will be in touch to move appt up.

## 2015-08-05 ENCOUNTER — Ambulatory Visit (INDEPENDENT_AMBULATORY_CARE_PROVIDER_SITE_OTHER): Payer: BLUE CROSS/BLUE SHIELD | Admitting: Cardiology

## 2015-08-05 ENCOUNTER — Encounter: Payer: Self-pay | Admitting: Cardiology

## 2015-08-05 VITALS — BP 120/80 | HR 74 | Ht 73.0 in | Wt 232.0 lb

## 2015-08-05 DIAGNOSIS — R002 Palpitations: Secondary | ICD-10-CM

## 2015-08-05 NOTE — Patient Instructions (Signed)
Medication Instructions:  Your physician recommends that you continue on your current medications as directed. Please refer to the Current Medication list given to you today.  Labwork: None ordered  Testing/Procedures: Your physician has recommended that you wear an event monitor. Event monitors are medical devices that record the heart's electrical activity. Doctors most often [place] these monitors to diagnose arrhythmias. Arrhythmias are problems with the speed or rhythm of the heartbeat. The monitor is a small, portable device. You can wear one while you do your normal daily activities. This is usually used to diagnose what is causing palpitations/syncope (passing out).  Follow-Up: To be determined once monitor results have been reviewed by the physician.  If you need a refill on your cardiac medications before your next appointment, please call your pharmacy.  Thank you for choosing [facility]!!   Trinidad Curet, RN [phone]    Cardiac Event Monitoring A cardiac event monitor is a small recording device used to help detect abnormal heart rhythms (arrhythmias). The monitor is used to record heart rhythm when noticeable symptoms such as the following occur:  Fast heartbeats (palpitations), such as heart racing or fluttering.  Dizziness.  Fainting or light-headedness.  Unexplained weakness. The monitor is wired to two electrodes placed on your chest. Electrodes are flat, sticky disks that attach to your skin. The monitor can be worn for up to 30 days. You will wear the monitor at all times, except when bathing.  HOW TO USE YOUR CARDIAC EVENT MONITOR A technician will prepare your chest for the electrode placement. The technician will show you how to place the electrodes, how to work the monitor, and how to replace the batteries. Take time to practice using the monitor before you leave the office. Make sure you understand how to send the information from the monitor to your  health care provider. This requires a telephone with a landline, not a cell phone. You need to:  Wear your monitor at all times, except when you are in water:  Do not get the monitor wet.  Take the monitor off when bathing. Do not swim or use a hot tub with it on.  Keep your skin clean. Do not put body lotion or moisturizer on your chest.  Change the electrodes daily or any time they stop sticking to your skin. You might need to use tape to keep them on.  It is possible that your skin under the electrodes could become irritated. To keep this from happening, try to put the electrodes in slightly different places on your chest. However, they must remain in the area under your left breast and in the upper right section of your chest.  Make sure the monitor is safely clipped to your clothing or in a location close to your body that your health care provider recommends.  Press the button to record when you feel symptoms of heart trouble, such as dizziness, weakness, light-headedness, palpitations, thumping, shortness of breath, unexplained weakness, or a fluttering or racing heart. The monitor is always on and records what happened slightly before you pressed the button, so do not worry about being too late to get good information.  Keep a diary of your activities, such as walking, doing chores, and taking medicine. It is especially important to note what you were doing when you pushed the button to record your symptoms. This will help your health care provider determine what might be contributing to your symptoms. The information stored in your monitor will be reviewed  by your health care provider alongside your diary entries.  Send the recorded information as recommended by your health care provider. It is important to understand that it will take some time for your health care provider to process the results.  Change the batteries as recommended by your health care provider. SEEK IMMEDIATE  MEDICAL CARE IF:   You have chest pain.  You have extreme difficulty breathing or shortness of breath.  You develop a very fast heartbeat that persists.  You develop dizziness that does not go away.  You faint or constantly feel you are about to faint.   This information is not intended to replace advice given to you by your health care provider. Make sure you discuss any questions you have with your health care provider.   Document Released: 02/11/2008 Document Revised: 05/25/2014 Document Reviewed: 10/31/2012 Elsevier Interactive Patient Education [employer].

## 2015-08-05 NOTE — Progress Notes (Signed)
s regular rate   Electrophysiology Office Note   Date:  08/05/2015   ID:  Jon [name], DOB 10-Jan-1966, MRN [id number]  PCP:  Crisoforo Oxford, PA-C  Primary Electrophysiologist:  Constance Haw, MD    Chief Complaint  Patient presents with  . Follow-up     History of Present Illness: Jon [name] is a 50 y.o. male who presents today for electrophysiology evaluation.   He has a h/o aflutter ablation 06/14/15 and now off xarelto for chadsvasc score of 0. He had irregular heart beat on 2/28 for 10 hours. He went to AF clinic but was in [name] when he arrived.  He had another episode of palpitations but was in sinus rhythm when he arrived to clinic for the second time.  He says that the palpitations feel very irregular.  He is unsure of what exacerbates or alleviates the symptoms.   Today, he denies symptoms of palpitations, chest pain, shortness of breath, orthopnea, PND, lower extremity edema, claudication, dizziness, presyncope, syncope, bleeding, or neurologic sequela. The patient is tolerating medications without difficulties and is otherwise without complaint today.    Past Medical History  Diagnosis Date  . Hyperlipidemia   . Fatigue     "due to the flutter" (06/14/2015)  . Paroxysmal a-fib ([place])   . Atrial flutter ([place])     Started on Xarelto 05/14/2015, s/p TEE DCCV on 1/5  . OSA on CPAP    Past Surgical History  Procedure Laterality Date  . Below knee leg amputation Right 2004  . Cardioversion N/A 05/23/2015    Procedure: CARDIOVERSION;  Surgeon: Sanda Klein, MD;  [facility]: [facility]OS[facility];  Service: Cardiovascular;  Laterality: N/A;  . Tee without cardioversion N/A 05/23/2015    Procedure: TRANSESOPHAGEAL ECHOCARDIOGRAM (TEE);  Surgeon: Sanda Klein, MD;  Location: [facility] ENDOSCOPY;  Service: Cardiovascular;  Laterality: N/A;  . Atrial flutter ablation  06/14/2015  . Electrophysiologic study N/A 06/14/2015    Procedure: A-Flutter Ablation;  Surgeon: Ginnifer Creelman [name], MD;  Location: [facility] CV LAB;  Service: Cardiovascular;  Laterality: N/A;     Current Outpatient Prescriptions  Medication Sig Dispense Refill  . ibuprofen (ADVIL,MOTRIN) 200 MG tablet Take 800 mg by mouth daily as needed for moderate pain.    . Magnesium 500 MG TABS Take 1 tablet by mouth daily.    . metoprolol tartrate (LOPRESSOR) 25 MG tablet Take 1 tablet (25 mg total) by mouth 2 (two) times daily. 180 tablet 3  . Multiple Vitamin (MULTIVITAMIN) tablet Take 1 tablet by mouth daily.      . Omega-3 Fatty Acids (FISH OIL PO) Take 2,400 mg by mouth daily.    . rivaroxaban (XARELTO) 20 MG TABS tablet Take 1 tablet (20 mg total) by mouth daily with supper. 30 tablet 5   No current facility-administered medications for this visit.    Allergies:   Review of patient's allergies indicates no known allergies.   Social History:  The patient  reports that he has never smoked. He has never used smokeless tobacco. He reports that he drinks about 13.2 oz of alcohol per week. He reports that he does not use illicit drugs.   Family History:  The patient's family history includes Heart disease (age of onset: 45) in his brother; Heart disease (age of onset: 1) in his father. There is no history of Cancer, Stroke, or Diabetes.    ROS:  Please see the history of present illness.   Otherwise, review of systems is positive for  palpitations.   All other systems are reviewed and negative.    PHYSICAL EXAM: VS:  BP 120/80 mmHg  Pulse 74  Ht 6\' 1"  (1.854 m)  Wt 232 lb (105.235 kg)  BMI 30.62 kg/m2 , BMI Body mass index is 30.62 kg/(m^2). GEN: Well nourished, well developed, in no acute distress HEENT: normal Neck: no JVD, carotid bruits, or masses Cardiac: RRR; no murmurs, rubs, or gallops,no edema  Respiratory:  clear to auscultation bilaterally, normal work of breathing GI: soft, nontender, nondistended, + BS MS: no deformity or atrophy Skin: warm and dry Neuro:  Strength and  sensation are intact Psych: euthymic mood, full affect  EKG:  EKG is ordered today. The ekg ordered today shows sinus rhythm, rate 74  Recent Labs: 05/04/2015: Magnesium 1.9; TSH 1.632 05/21/2015: ALT 26 06/03/2015: BUN 13; Creat 0.82; Hemoglobin 14.5; Platelets 219; Potassium 4.4; Sodium 142    Lipid Panel     Component Value Date/Time   CHOL 185 05/22/2015 0130   TRIG 176* 05/22/2015 0130   HDL 33* 05/22/2015 0130   CHOLHDL 5.6 05/22/2015 0130   VLDL 35 05/22/2015 0130   LDLCALC 117* 05/22/2015 0130     Wt Readings from Last 3 Encounters:  08/05/15 232 lb (105.235 kg)  07/17/15 229 lb 3.2 oz (103.964 kg)  07/15/15 230 lb 3.2 oz (104.418 kg)      Other studies Reviewed: Additional studies/ records that were reviewed today include: TEE 05/23/15  Review of the above records today demonstrates:  - Left ventricle: The cavity size was normal. Wall thickness was  normal. Systolic function was normal. The estimated ejection  fraction was in the range of 55% to 60%. No evidence of thrombus. - Mitral valve: There was mild regurgitation directed centrally. - Left atrium: The atrium was mildly dilated. No evidence of  thrombus in the atrial cavity or appendage. No spontaneous echo  contrast was observed.   ASSESSMENT AND PLAN:  1.  Atrial flutter: Has CHADS2VASc score of 0 an anticoagulation. Currently having symptoms of palpitations.  Due to his symptoms, we'll place a 30 day monitor to try and find the source of his palpitations.  From his description, he may be having atrial fibrillation.    Current medicines are reviewed at length with the patient today.   The patient does not have concerns regarding his medicines.  The following changes were made today:  none  Labs/ tests ordered today include:  Orders Placed This Encounter  Procedures  . Cardiac event monitor  . EKG 12-Lead     Disposition:   FU with Alcario Tinkey pending monitor  Signed, Deamonte Sayegh [name],  MD  08/05/2015 4:14 PM     [place] [address] [place] [address] [phone] (office) [phone] (fax)

## 2015-08-07 ENCOUNTER — Ambulatory Visit (INDEPENDENT_AMBULATORY_CARE_PROVIDER_SITE_OTHER): Payer: BLUE CROSS/BLUE SHIELD

## 2015-08-07 DIAGNOSIS — R002 Palpitations: Secondary | ICD-10-CM

## 2015-08-09 ENCOUNTER — Other Ambulatory Visit: Payer: Self-pay | Admitting: Physician Assistant

## 2015-08-09 ENCOUNTER — Encounter (HOSPITAL_COMMUNITY): Payer: Self-pay | Admitting: *Deleted

## 2015-08-09 ENCOUNTER — Telehealth: Payer: Self-pay

## 2015-08-09 ENCOUNTER — Emergency Department (HOSPITAL_COMMUNITY): Payer: BLUE CROSS/BLUE SHIELD

## 2015-08-09 ENCOUNTER — Emergency Department (HOSPITAL_COMMUNITY)
Admission: EM | Admit: 2015-08-09 | Discharge: 2015-08-09 | Disposition: A | Discharge status: Home / Self Care | Payer: BLUE CROSS/BLUE SHIELD | Attending: Emergency Medicine | Admitting: Emergency Medicine

## 2015-08-09 DIAGNOSIS — Z7982 Long term (current) use of aspirin: Secondary | ICD-10-CM | POA: Diagnosis not present

## 2015-08-09 DIAGNOSIS — Z79899 Other long term (current) drug therapy: Secondary | ICD-10-CM | POA: Insufficient documentation

## 2015-08-09 DIAGNOSIS — R002 Palpitations: Secondary | ICD-10-CM | POA: Diagnosis present

## 2015-08-09 DIAGNOSIS — Z8639 Personal history of other endocrine, nutritional and metabolic disease: Secondary | ICD-10-CM | POA: Insufficient documentation

## 2015-08-09 DIAGNOSIS — I480 Paroxysmal atrial fibrillation: Secondary | ICD-10-CM

## 2015-08-09 DIAGNOSIS — Z89521 Acquired absence of right knee: Secondary | ICD-10-CM | POA: Diagnosis not present

## 2015-08-09 DIAGNOSIS — Z9981 Dependence on supplemental oxygen: Secondary | ICD-10-CM | POA: Insufficient documentation

## 2015-08-09 DIAGNOSIS — Z9889 Other specified postprocedural states: Secondary | ICD-10-CM | POA: Diagnosis not present

## 2015-08-09 DIAGNOSIS — I4891 Unspecified atrial fibrillation: Secondary | ICD-10-CM | POA: Diagnosis not present

## 2015-08-09 DIAGNOSIS — G4733 Obstructive sleep apnea (adult) (pediatric): Secondary | ICD-10-CM | POA: Diagnosis not present

## 2015-08-09 LAB — CBC
HEMATOCRIT: 45.2 % (ref 39.0–52.0)
HEMOGLOBIN: 15.5 g/dL (ref 13.0–17.0)
MCH: 31.9 pg (ref 26.0–34.0)
MCHC: 34.3 g/dL (ref 30.0–36.0)
MCV: 93 fL (ref 78.0–100.0)
Platelets: 196 10*3/uL (ref 150–400)
RBC: 4.86 MIL/uL (ref 4.22–5.81)
RDW: 12.8 % (ref 11.5–15.5)
WBC: 7.3 10*3/uL (ref 4.0–10.5)

## 2015-08-09 LAB — BASIC METABOLIC PANEL
ANION GAP: 8 (ref 5–15)
BUN: 8 mg/dL (ref 6–20)
CO2: 25 mmol/L (ref 22–32)
Calcium: 9 mg/dL (ref 8.9–10.3)
Chloride: 106 mmol/L (ref 101–111)
Creatinine, Ser: 0.84 mg/dL (ref 0.61–1.24)
GFR calc Af Amer: >60 mL/min (ref >60)
GLUCOSE: 135 mg/dL — AB (ref 65–99)
POTASSIUM: 4 mmol/L (ref 3.5–5.1)
Sodium: 139 mmol/L (ref 135–145)

## 2015-08-09 LAB — PROTIME-INR
INR: 1.02 (ref 0.00–1.49)
Prothrombin Time: 13.6 seconds (ref 11.6–15.2)

## 2015-08-09 MED ORDER — SODIUM CHLORIDE 0.9 % IV BOLUS (SEPSIS)
250.0000 mL | Freq: Once | INTRAVENOUS | Status: AC
  Administered 2015-08-09: 250 mL via INTRAVENOUS

## 2015-08-09 MED ORDER — SODIUM CHLORIDE 0.9 % IV SOLN
INTRAVENOUS | Status: DC
  Administered 2015-08-09: 13:00:00 via INTRAVENOUS

## 2015-08-09 NOTE — ED Notes (Signed)
Patient converted to sinus rhythm, ekg given to dr. Helane Gunther

## 2015-08-09 NOTE — Telephone Encounter (Signed)
Triage nurse received monitor critical monitor result of pt in A. Fib RVR HR 180, started at 10 am. Pt contacted stated he started having palpitations about 9 am this morning but no other complaints. Pt at work currently, sent in new report from monitor. Report received pt still in A. Fib with HR 160-180. Pt told to go to the emergency room, preferably [facility]. Told he needs to find a driver to take him there. Pt verbalized understanding, no additional questions.  [place] made aware to expect pt arrival

## 2015-08-09 NOTE — ED Notes (Signed)
Pt reports hx of atrial flutter and had ablation done in feb. Pt has been wearing a heart monitor due to palpitations and was sent here today doe to atrial fib RVR. ekg done, no resp distress noted at triage.

## 2015-08-09 NOTE — ED Provider Notes (Signed)
  Physical Exam  BP 107/77 mmHg  Pulse 79  Temp(Src) 98.4 F (36.9 C) (Oral)  Resp 16  Ht 6\' 1"  (1.854 m)  Wt 225 lb (102.059 kg)  BMI 29.69 kg/m2  SpO2 97%  Physical Exam  ED Course  Procedures  MDM Patient is been seen by electrophysiology. Will discharge home. Started on flecainide.      Davonna Belling, MD 08/09/15 [phone]

## 2015-08-09 NOTE — Consult Note (Signed)
ELECTROPHYSIOLOGY CONSULT NOTE    Patient ID: Jon Jon [name] MRN: [id number], DOB/AGE: 01/28/1966 50 y.o.  Admit date: 08/09/2015 Date of Consult: 08/09/2015  Primary Physician: Crisoforo Oxford, PA-C Primary Cardiologist: Dr. Martinique Electrophysiologist: Dr. Curt Bears  Reason for Consultation: Afib  RVR  HPI: Jon Jon [name] is a 50 y.o. male with PMHx of Aflutter s/p ablation Jan 2017 now off Xarelto with CHA2DS2Vasc of zero and HLD with recent hx of new palpitations post flutter ablation is wearing an event monitor was called by the office secondary to a transmission with AFib HR 180's and instructed to go to the ER.  He reports he had been having palpitations thoday day, not necessarily did he feel it racing or fast but definitely irregular. The only difference today was he felt a little sweaty, not diaphoretic, not dizzy, no CP or SOB, no near syncope or syncope.  He forgot this morning to take his Metoprolol, otherwise noting unusual about today, and reports that he has days with palpitations and has taken his metoprolol.  He has not been ill recently, no fever, N/V.  Upon arrival to the ER he was noted to be in AFib, HR 157bpm and spontaneously converted to SR.  Since here he has had short bursts of PAFib, is currently in SR 80's. BP stable Labs unremarkable CXR clear  Past Medical History  Diagnosis Date  . Hyperlipidemia   . Fatigue     "due to the flutter" (06/14/2015)  . Paroxysmal a-fib ([place])   . Atrial flutter ([place])     Started on Xarelto 05/14/2015, s/p TEE DCCV on 1/5  . OSA on CPAP      Surgical History:  Past Surgical History  Procedure Laterality Date  . Below knee leg amputation Right 2004  . Cardioversion N/A 05/23/2015    Procedure: CARDIOVERSION;  Surgeon: Sanda Klein, MD;  Location: [facility] ENDOSCOPY;  Service: Cardiovascular;  Laterality: N/A;  . Tee without cardioversion N/A 05/23/2015    Procedure: TRANSESOPHAGEAL ECHOCARDIOGRAM (TEE);  Surgeon: Sanda Klein, MD;  Location: [facility] ENDOSCOPY;  Service: Cardiovascular;  Laterality: N/A;  . Atrial flutter ablation  06/14/2015  . Electrophysiologic study N/A 06/14/2015    Procedure: A-Flutter Ablation;  Surgeon: Shadell Brenn [name], MD;  Location: [place] CV LAB;  Service: Cardiovascular;  Laterality: N/A;      (Not in a hospital admission)  Inpatient Medications:    Allergies: No Known Allergies  Social History   Social History  . Marital Status: Married    Spouse Name: N/A  . Number of Children: 1  . Years of Education: N/A   Occupational History  . supervisor     [employer]   Social History Main Topics  . Smoking status: Never Smoker   . Smokeless tobacco: Never Used  . Alcohol Use: 13.2 oz/week    0 Standard drinks or equivalent, 22 Cans of beer per week     Comment: 06/14/2015 "2 beers X 5 days/week; 6 beers 2 days/wk"  . Drug Use: No  . Sexual Activity: Yes   Other Topics Concern  . Not on file   Social History Narrative   Married, 1 child, 24yo, exercises 3 times per week, eats healthy.   Works as Naval architect at [employer], Programmer, systems.      Family History  Problem Relation Age of Onset  . Heart disease Father 27    CABG  . Heart disease Brother 84    died of MI  . Cancer  Neg Hx   . Stroke Neg Hx   . Diabetes Neg Hx      Review of Systems: All other systems reviewed and are otherwise negative except as noted above.  Physical Exam: Filed Vitals:   08/09/15 1400 08/09/15 1430 08/09/15 1500 08/09/15 1531  BP: 135/73 117/82 106/79 128/96  Pulse: 75 70 35 66  Temp:      TempSrc:      Resp: 15 13 16 19   Height:      Weight:      SpO2: 98% 99% 99% 96%    GEN- The patient is Jon [name] appearing, alert and oriented x 3 today.   HEENT: normocephalic, atraumatic; sclera clear, conjunctiva pink; hearing intact; oropharynx clear; neck supple, no JVP Lymph- no cervical lymphadenopathy Lungs- Clear to ausculation bilaterally, normal work  of breathing.  No wheezes, rales, rhonchi Heart- Regular rate and rhythm, no murmurs, rubs or gallops, PMI not laterally displaced GI- soft, non-tender, non-distended, bowel sounds present Extremities- no clubbing, cyanosis, or edema; RLE prosthesis MS- no significant deformity or atrophy Skin- warm and dry, no rash or lesion Psych- euthymic mood, full affect Neuro- no gross deficits observed  Labs:   Lab Results  Component Value Date   WBC 7.3 08/09/2015   HGB 15.5 08/09/2015   HCT 45.2 08/09/2015   MCV 93.0 08/09/2015   PLT 196 08/09/2015    Recent Labs Lab 08/09/15 1237  NA 139  K 4.0  CL 106  CO2 25  BUN 8  CREATININE 0.84  CALCIUM 9.0  GLUCOSE 135*      Radiology/Studies:  Dg Chest Port 1 View 08/09/2015  CLINICAL DATA:  Tachycardia intermittently for 3 months, worse this week. EXAM: PORTABLE CHEST 1 VIEW COMPARISON:  05/21/2015 FINDINGS: An electronic device overlies the left chest. The cardiac silhouette is upper limits of normal in size. No confluent airspace opacity, edema, pleural effusion, or pneumothorax is identified. Thoracic spondylosis is noted. IMPRESSION: No active disease. Electronically Signed   By: Logan Bores M.D.   On: 08/09/2015 13:20    EKG:  #1 Afib w/RVR, 157bpm #2 SR 93bpm #3 SR, VPC's  TELEMETRY: SR 80's, intermittent short runs AFib 120's  05/23/15: TEE Study Conclusions - Left ventricle: The cavity size was normal. Wall thickness was  normal. Systolic function was normal. The estimated ejection  fraction was in the range of 55% to 60%. No evidence of thrombus. - Mitral valve: There was mild regurgitation directed centrally. - Left atrium: The atrium was mildly dilated. No evidence of  thrombus in the atrial cavity or appendage. No spontaneous echo  contrast was observed. Impressions: - Successful cardioversion. No cardiac source of emboli was  indentified.   Assessment and Plan:  1. New PAFib, RVR     Spontaneously  converted to SR     Hx of AFlutter with ablation 06/14/15 by Dr. Ky Barban is zero, not on a/c     We Alyssabeth Bruster start Flecainide 75mg  BID along with his home Metoprolol 25mg  BID  2. HLD  OK to discharge home from the ER, to f/u in the office for a stress test and f/u visit with Dr. Curt Bears has been arranged.  Venetia Night, PA-C 08/09/2015 3:55 PM    I have seen and examined this patient with Tommye Standard.  Agree with above, note added to reflect my findings.  On exam, regular rhythm, no murmurs, lungs clear.  History of flutter ablation presents with atrial fibrillation today.  Having runs of tachycardia while in the ER.  Patient with minimal symptoms, only palpitations.  Gaila Engebretsen plan for flecainide with ETT in 7-10 days.  Has CHADS2VASc of 0 and therefore does not require anticoagulation.  Azyiah Bo M. Reesa Gotschall MD 08/09/2015 9:13 PM

## 2015-08-09 NOTE — Discharge Instructions (Signed)

## 2015-08-09 NOTE — ED Provider Notes (Signed)
CSN: [id number]     Arrival date & time 08/09/15  1200 History   First MD Initiated Contact with Patient 08/09/15 1226     Chief Complaint  Patient presents with  . Atrial Fibrillation  . Palpitations     (Consider location/radiation/quality/duration/timing/severity/associated sxs/prior Treatment) Patient is a 50 y.o. male presenting with atrial fibrillation and palpitations. The history is provided by the patient.  Atrial Fibrillation Pertinent negatives include no chest pain, no abdominal pain, no headaches and no shortness of breath.  Palpitations Associated symptoms: no back pain, no chest pain, no nausea, no shortness of breath and no vomiting    patient followed by local cardiology. Patient had ablation done in February for atrial flutter. Also has had a history of peroxisomal atrial fib they felt was due to the flutter. Patient of late is felt as if his heart is been going fast. So cardiology recently put him on cardiac monitor. Monitor today showed evidence of rapid atrial fib with a heart rate around the 150 or higher. Was contacted by the cardiology office and told to come in. Patient was not aware that his heart was going fast. He did feel little fatigue. Patient's stopped as blood thinners at the request of his physician 2 weeks ago. So no longer on Xarelto. Patient denies any chest pain denies any fever or shortness of breath no abdominal pain no nausea or vomiting.  Past Medical History  Diagnosis Date  . Hyperlipidemia   . Fatigue     "due to the flutter" (06/14/2015)  . Paroxysmal a-fib ([place])   . Atrial flutter ([place])     Started on Xarelto 05/14/2015, s/p TEE DCCV on 1/5  . OSA on CPAP    Past Surgical History  Procedure Laterality Date  . Below knee leg amputation Right 2004  . Cardioversion N/A 05/23/2015    Procedure: CARDIOVERSION;  Surgeon: Sanda Klein, MD;  Location: [facility] ENDOSCOPY;  Service: Cardiovascular;  Laterality: N/A;  . Tee without cardioversion N/A  05/23/2015    Procedure: TRANSESOPHAGEAL ECHOCARDIOGRAM (TEE);  Surgeon: Sanda Klein, MD;  Location: [facility] ENDOSCOPY;  Service: Cardiovascular;  Laterality: N/A;  . Atrial flutter ablation  06/14/2015  . Electrophysiologic study N/A 06/14/2015    Procedure: A-Flutter Ablation;  Surgeon: Will [name], MD;  Location: [place] [facility];  Service: Cardiovascular;  Laterality: N/A;   Family History  Problem Relation Age of Onset  . Heart disease Father 55    CABG  . Heart disease Brother 62    died of MI  . Cancer Neg Hx   . Stroke Neg Hx   . Diabetes Neg Hx    Social History  Substance Use Topics  . Smoking status: Never Smoker   . Smokeless tobacco: Never Used  . Alcohol Use: 13.2 oz/week    0 Standard drinks or equivalent, 22 Cans of beer per week     Comment: 06/14/2015 "2 beers X 5 days/week; 6 beers 2 days/wk"    Review of Systems  Constitutional: Negative for fever.  HENT: Negative for congestion.   Eyes: Negative for visual disturbance.  Respiratory: Negative for shortness of breath.   Cardiovascular: Positive for palpitations. Negative for chest pain.  Gastrointestinal: Negative for nausea, vomiting and abdominal pain.  Genitourinary: Negative for dysuria.  Musculoskeletal: Negative for back pain.  Skin: Negative for rash.  Neurological: Negative for headaches.  Hematological: Does not bruise/bleed easily.  Psychiatric/Behavioral: Negative for confusion.      Allergies  Review of patient's  allergies indicates no known allergies.  Home Medications   Prior to Admission medications   Medication Sig Start Date End Date Taking? Authorizing Provider  aspirin EC 81 MG tablet Take 81 mg by mouth daily.   Yes Historical Provider, MD  Magnesium 500 MG TABS Take 1 tablet by mouth daily.   Yes Historical Provider, MD  metoprolol tartrate (LOPRESSOR) 25 MG tablet Take 1 tablet (25 mg total) by mouth 2 (two) times daily. 05/23/15  Yes Almyra Deforest, PA  Multiple Vitamin  (MULTIVITAMIN) tablet Take 1 tablet by mouth daily.     Yes Historical Provider, MD  Omega-3 Fatty Acids (FISH OIL PO) Take 2,400 mg by mouth daily.   Yes Historical Provider, MD  rivaroxaban (XARELTO) 20 MG TABS tablet Take 1 tablet (20 mg total) by mouth daily with supper. Patient not taking: Reported on 08/09/2015 06/15/15   Brett Canales, PA-C   BP 117/82 mmHg  Pulse 70  Temp(Src) 98.4 F (36.9 C) (Oral)  Resp 13  Ht 6\' 1"  (1.854 m)  Wt 102.059 kg  BMI 29.69 kg/m2  SpO2 99% Physical Exam  Constitutional: He is oriented to person, place, and time. He appears well-developed and well-nourished. No distress.  HENT:  Head: Normocephalic and atraumatic.  Mouth/Throat: Oropharynx is clear and moist.  Eyes: Conjunctivae and EOM are normal. Pupils are equal, round, and reactive to light.  Neck: Normal range of motion. Neck supple.  Cardiovascular:  Irregular rapid rate.  Pulmonary/Chest: Effort normal and breath sounds normal. No respiratory distress. He has no wheezes. He has no rales.  Abdominal: Soft. Bowel sounds are normal. There is no tenderness.  Musculoskeletal: Normal range of motion.  Below the knee right leg amputation.  Neurological: He is alert and oriented to person, place, and time. No cranial nerve deficit. He exhibits normal muscle tone. Coordination normal.  Nursing note and vitals reviewed.   ED Course  Procedures (including critical care time) Labs Review Labs Reviewed  BASIC METABOLIC PANEL - Abnormal; Notable for the following:    Glucose, Bld 135 (*)    All other components within normal limits  CBC  PROTIME-INR   Results for orders placed or performed during the hospital encounter of 123456  Basic metabolic panel  Result Value Ref Range   Sodium 139 135 - 145 mmol/L   Potassium 4.0 3.5 - 5.1 mmol/L   Chloride 106 101 - 111 mmol/L   CO2 25 22 - 32 mmol/L   Glucose, Bld 135 (H) 65 - 99 mg/dL   BUN 8 6 - 20 mg/dL   Creatinine, Ser 0.84 0.61 - 1.24  mg/dL   Calcium 9.0 8.9 - 10.3 mg/dL   GFR calc non Af Amer >60 >60 mL/min   GFR calc Af Amer >60 >60 mL/min   Anion gap 8 5 - 15  CBC  Result Value Ref Range   WBC 7.3 4.0 - 10.5 K/uL   RBC 4.86 4.22 - 5.81 MIL/uL   Hemoglobin 15.5 13.0 - 17.0 g/dL   HCT 45.2 39.0 - 52.0 %   MCV 93.0 78.0 - 100.0 fL   MCH 31.9 26.0 - 34.0 pg   MCHC 34.3 30.0 - 36.0 g/dL   RDW 12.8 11.5 - 15.5 %   Platelets 196 150 - 400 K/uL  Protime-INR  Result Value Ref Range   Prothrombin Time 13.6 11.6 - 15.2 seconds   INR 1.02 0.00 - 1.49     Imaging Review Dg Chest [facility] [address]  08/09/2015  CLINICAL DATA:  Tachycardia intermittently for 3 months, worse this week. EXAM: PORTABLE CHEST 1 VIEW COMPARISON:  05/21/2015 FINDINGS: An electronic device overlies the left chest. The cardiac silhouette is upper limits of normal in size. No confluent airspace opacity, edema, pleural effusion, or pneumothorax is identified. Thoracic spondylosis is noted. IMPRESSION: No active disease. Electronically Signed   By: Logan Bores M.D.   On: 08/09/2015 13:20   I have personally reviewed and evaluated these images and lab results as part of my medical decision-making.   EKG Interpretation   Date/Time:  Friday August 09 2015 14:11:44 EDT Ventricular Rate:  78 PR Interval:  151 QRS Duration: 99 QT Interval:  356 QTC Calculation: 405 R Axis:   64 Text Interpretation:  Sinus tachycardia Multiform ventricular premature  complexes RSR' in V1 or V2, probably normal variant Confirmed by Abegail Kloeppel   MD, Avontae Burkhead [phone]) on 08/09/2015 2:16:51 PM      MDM   Final diagnoses:  Atrial fibrillation with rapid ventricular response ([place])    Patient since ablation for the atrial fibrillation has felt that he's been having some rapid heart rate. They recently started monitoring him a heart monitor showed rapid atrial fib today he was not aware of that he was contacted by cardiology office and told to come into the emergency department.  Upon arrival here patient's heart rate was in the range of 117-150. Followed by Dr. Orson Ape  office for cardiology. Patient has been off blood thinners for 2 weeks at the request of his cardiologist.  Patient in no distress. But did feel a little fatigued. No chest pain. Denied any shortness of breath. Oxygen sats on arrival were 96%  Patient converted spontaneously to a normal sinus rhythm. There was a third EKG that showed evidence of occasional atrial fib so he is been waxing and waning. Patient's current heart rate is 70. Blood pressure is 117/82.  Cardiology will come and see the patient.    Fredia Sorrow, MD 08/09/15 [phone]

## 2015-08-15 ENCOUNTER — Ambulatory Visit (INDEPENDENT_AMBULATORY_CARE_PROVIDER_SITE_OTHER): Payer: BLUE CROSS/BLUE SHIELD

## 2015-08-15 DIAGNOSIS — I480 Paroxysmal atrial fibrillation: Secondary | ICD-10-CM | POA: Diagnosis not present

## 2015-08-15 LAB — EXERCISE TOLERANCE TEST
CHL CUP RESTING HR STRESS: 59 {beats}/min
CHL CUP STRESS STAGE 1 DBP: 75 mmHg
CHL CUP STRESS STAGE 1 GRADE: 0 %
CHL CUP STRESS STAGE 2 SPEED: 1 mph
CHL CUP STRESS STAGE 3 HR: 62 {beats}/min
CHL CUP STRESS STAGE 3 SPEED: 1 mph
CHL CUP STRESS STAGE 4 GRADE: 10 %
CHL CUP STRESS STAGE 4 SPEED: 1.7 mph
CHL CUP STRESS STAGE 6 GRADE: 14 %
CHL CUP STRESS STAGE 6 SPEED: 3.4 mph
CHL CUP STRESS STAGE 7 DBP: 47 mmHg
CHL CUP STRESS STAGE 7 GRADE: 0 %
CHL CUP STRESS STAGE 7 HR: 78 {beats}/min
CHL CUP STRESS STAGE 7 SBP: 103 mmHg
CHL CUP STRESS STAGE 8 DBP: 66 mmHg
CHL CUP STRESS STAGE 8 SBP: 111 mmHg
CHL RATE OF PERCEIVED EXERTION: 16
CSEPED: 9 min
CSEPEDS: 0 s
CSEPEW: 10.1 METS
CSEPHR: 67 %
MPHR: 171 {beats}/min
Peak HR: 95 {beats}/min
Percent of predicted max HR: 55 %
Stage 1 HR: 62 {beats}/min
Stage 1 SBP: 115 mmHg
Stage 1 Speed: 0 mph
Stage 2 Grade: 0 %
Stage 2 HR: 62 {beats}/min
Stage 3 Grade: 0 %
Stage 4 HR: 81 {beats}/min
Stage 5 Grade: 12 %
Stage 5 HR: 93 {beats}/min
Stage 5 Speed: 2.5 mph
Stage 6 HR: 95 {beats}/min
Stage 7 Speed: 0 mph
Stage 8 Grade: 0 %
Stage 8 HR: 59 {beats}/min
Stage 8 Speed: 0 mph

## 2015-08-16 ENCOUNTER — Telehealth: Payer: Self-pay | Admitting: *Deleted

## 2015-08-16 NOTE — Telephone Encounter (Signed)
error 

## 2015-08-16 NOTE — Telephone Encounter (Signed)
-----   Message from [facility], [place] sent at 08/16/2015 11:30 AM EDT ----- Please let the patient know I reviewed his stress test with Dr. Curt Bears, looks good to continue his medication, no changes to be made.  To keep his appointment as scheduled with Dr. Curt Bears.  Thanks [employer]

## 2015-09-09 NOTE — Progress Notes (Signed)
This encounter was created in error - please disregard.

## 2015-09-10 ENCOUNTER — Ambulatory Visit: Payer: BLUE CROSS/BLUE SHIELD | Admitting: Cardiology

## 2015-09-10 ENCOUNTER — Encounter: Payer: BLUE CROSS/BLUE SHIELD | Admitting: Cardiology

## 2015-09-11 ENCOUNTER — Ambulatory Visit: Payer: BLUE CROSS/BLUE SHIELD | Admitting: Cardiology

## 2015-09-16 NOTE — Progress Notes (Signed)
s regular rate   Electrophysiology Office Note   Date:  09/17/2015   ID:  Jon [name], DOB 02-03-1966, MRN [id number]  PCP:  Jon Oxford, PA-C  Primary Electrophysiologist:  Jon Haw, MD    Chief Complaint  Patient presents with  . Atrial Fibrillation     History of Present Illness: Jon [name] is a 50 y.o. male who presents today for electrophysiology evaluation.   He has a h/o aflutter ablation 06/14/15 and now off xarelto for chadsvasc score of 0. He had irregular heart beat on 2/28 for 10 hours. He went to AF clinic but was in [place] when he arrived.  He presented to the ER 08/09/15 in atrial fibrillation.  He broke to sinus rhythm but had many runs.  He was started on flecainide while in the emergency room. Since starting the medication, he is felt much better. He has had much less atrial fibrillation on his monitor. He says that when the medication is wearing off he has 10 seconds worth of palpitations. The palpitations are much improved and he says that they go away quickly after he takes his nighttime doses of his medicines.  Today, he denies symptoms of palpitations, chest pain, shortness of breath, orthopnea, PND, lower extremity edema, claudication, dizziness, presyncope, syncope, bleeding, or neurologic sequela. The patient is tolerating medications without difficulties and is otherwise without complaint today.    Past Medical History  Diagnosis Date  . Hyperlipidemia   . Fatigue     "due to the flutter" (06/14/2015)  . Paroxysmal a-fib ([place])   . Atrial flutter ([place])     Started on Xarelto 05/14/2015, s/p TEE DCCV on 1/5  . OSA on CPAP    Past Surgical History  Procedure Laterality Date  . Below knee leg amputation Right 2004  . Cardioversion N/A 05/23/2015    Procedure: CARDIOVERSION;  Surgeon: Jon Klein, MD;  Location: [facility] ENDOSCOPY;  Service: Cardiovascular;  Laterality: N/A;  . Tee without cardioversion N/A 05/23/2015    Procedure:  TRANSESOPHAGEAL ECHOCARDIOGRAM (TEE);  Surgeon: Jon Klein, MD;  Location: [facility] ENDOSCOPY;  Service: Cardiovascular;  Laterality: N/A;  . Atrial flutter ablation  06/14/2015  . Electrophysiologic study N/A 06/14/2015    Procedure: A-Flutter Ablation;  Surgeon: Jon Grandt [name], MD;  Location: [facility] [facility] LAB;  Service: Cardiovascular;  Laterality: N/A;     Current Outpatient Prescriptions  Medication Sig Dispense Refill  . aspirin EC 81 MG tablet Take 81 mg by mouth daily.    . Magnesium 500 MG TABS Take 1 tablet by mouth daily.    . metoprolol tartrate (LOPRESSOR) 25 MG tablet Take 1 tablet (25 mg total) by mouth 2 (two) times daily. 180 tablet 3  . Multiple Vitamin (MULTIVITAMIN) tablet Take 1 tablet by mouth daily.      . Omega-3 Fatty Acids (FISH OIL PO) Take 2,400 mg by mouth daily.     No current facility-administered medications for this visit.    Allergies:   Review of patient's allergies indicates no known allergies.   Social History:  The patient  reports that he has never smoked. He has never used smokeless tobacco. He reports that he drinks about 13.2 oz of alcohol per week. He reports that he does not use illicit drugs.   Family History:  The patient's family history includes Heart disease (age of onset: 55) in his brother; Heart disease (age of onset: 21) in his father. There is no history of Cancer, Stroke, or Diabetes.  ROS:  Please see the history of present illness.   Otherwise, review of systems is positive for palpitations.   All other systems are reviewed and negative.    PHYSICAL EXAM: VS:  BP 102/64 mmHg  Pulse 66  Ht 6\' 1"  (1.854 m)  Wt 229 lb 3.2 oz (103.964 kg)  BMI 30.25 kg/m2 , BMI Body mass index is 30.25 kg/(m^2). GEN: Well nourished, well developed, in no acute distress HEENT: normal Neck: no JVD, carotid bruits, or masses Cardiac: RRR; no murmurs, rubs, or gallops,no edema  Respiratory:  clear to auscultation bilaterally, normal work of  breathing GI: soft, nontender, nondistended, + BS MS: no deformity or atrophy Skin: warm and dry Neuro:  Strength and sensation are intact Psych: euthymic mood, full affect  EKG:  EKG is ordered today. The ekg ordered today shows sinus rhythm, rate 66  Recent Labs: 05/04/2015: Magnesium 1.9; TSH 1.632 05/21/2015: ALT 26 08/09/2015: BUN 8; Creatinine, Ser 0.84; Hemoglobin 15.5; Platelets 196; Potassium 4.0; Sodium 139    Lipid Panel     Component Value Date/Time   CHOL 185 05/22/2015 0130   TRIG 176* 05/22/2015 0130   HDL 33* 05/22/2015 0130   CHOLHDL 5.6 05/22/2015 0130   VLDL 35 05/22/2015 0130   LDLCALC 117* 05/22/2015 0130     Wt Readings from Last 3 Encounters:  09/17/15 229 lb 3.2 oz (103.964 kg)  08/09/15 225 lb (102.059 kg)  08/05/15 232 lb (105.235 kg)      Other studies Reviewed: Additional studies/ records that were reviewed today include: TEE 05/23/15  Review of the above records today demonstrates:  - Left ventricle: The cavity size was normal. Wall thickness was  normal. Systolic function was normal. The estimated ejection  fraction was in the range of 55% to 60%. No evidence of thrombus. - Mitral valve: There was mild regurgitation directed centrally. - Left atrium: The atrium was mildly dilated. No evidence of  thrombus in the atrial cavity or appendage. No spontaneous echo  contrast was observed.  30 day monitor 08/07/15 Primary rhythm sinus Episodes of atrial fibrillation centered around 08/09/15 APCs and PVCs  ASSESSMENT AND PLAN:  1.  Atrial flutter: Has CHADS2VASc score of 0 an anticoagulation. Ablation 06/14/15.  No further flutter.  2. Paroxysmal atrial fibrillation: was seen in the ER and started on metoprolol.  Continued to have atrial fibrillation on event monitor.  He is taking flecainide which was started in the hospital and having good results with much less symptoms.  No changes necessary.  He is on Xarelto. [name] stop that due to low  CHADS2VASc score.  This patients CHA2DS2-VASc Score and unadjusted Ischemic Stroke Rate (% per year) is equal to 0.2 % stroke rate/year from a score of 0  Above score calculated as 1 point each if present [CHF, HTN, DM, Vascular=MI/PAD/Aortic Plaque, Age if 65-74, or Male] Above score calculated as 2 points each if present [Age > 75, or Stroke/TIA/TE]      Current medicines are reviewed at length with the patient today.   The patient does not have concerns regarding his medicines.  The following changes were made today:  none  Labs/ tests ordered today include:  No orders of the defined types were placed in this encounter.     Disposition:   FU with Aarian Cleaver 6 months  Signed, Prospero Mahnke [name], MD  09/17/2015 3:11 PM     [address] [place]  [id number] [phone] (office) [phone] (  fax)

## 2015-09-17 ENCOUNTER — Ambulatory Visit (INDEPENDENT_AMBULATORY_CARE_PROVIDER_SITE_OTHER): Payer: BLUE CROSS/BLUE SHIELD | Admitting: Cardiology

## 2015-09-17 ENCOUNTER — Encounter: Payer: Self-pay | Admitting: Cardiology

## 2015-09-17 VITALS — BP 102/64 | HR 66 | Ht 73.0 in | Wt 229.2 lb

## 2015-09-17 DIAGNOSIS — I480 Paroxysmal atrial fibrillation: Secondary | ICD-10-CM | POA: Diagnosis not present

## 2015-10-18 ENCOUNTER — Encounter: Payer: Self-pay | Admitting: Cardiology

## 2015-10-18 ENCOUNTER — Ambulatory Visit (INDEPENDENT_AMBULATORY_CARE_PROVIDER_SITE_OTHER): Payer: Managed Care, Other (non HMO) | Admitting: Cardiology

## 2015-10-18 VITALS — BP 102/58 | HR 64 | Ht 73.0 in | Wt 228.0 lb

## 2015-10-18 DIAGNOSIS — I480 Paroxysmal atrial fibrillation: Secondary | ICD-10-CM

## 2015-10-18 DIAGNOSIS — I483 Typical atrial flutter: Secondary | ICD-10-CM

## 2015-10-18 MED ORDER — FLECAINIDE ACETATE 50 MG PO TABS: 50.0000 mg | ORAL_TABLET | Freq: Two times a day (BID) | ORAL | Status: DC
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
## 2015-10-18 NOTE — Patient Instructions (Signed)
Continue your current therapy  I will see you in one year   

## 2015-12-12 ENCOUNTER — Telehealth: Payer: Self-pay

## 2015-12-12 NOTE — Telephone Encounter (Signed)
Pt called the office to see if the form for his prosthetics supplies from [employer].   I have not rcvd any faxes recently and I'm not sure if they have been

## 2015-12-13 NOTE — Telephone Encounter (Signed)
Pt notified and will have [employer] resend information.

## 2015-12-13 NOTE — Telephone Encounter (Signed)
I haven't seen any. 

## 2015-12-17 ENCOUNTER — Ambulatory Visit (INDEPENDENT_AMBULATORY_CARE_PROVIDER_SITE_OTHER): Payer: Managed Care, Other (non HMO) | Admitting: Medical

## 2015-12-17 ENCOUNTER — Encounter: Payer: Self-pay | Admitting: Medical

## 2015-12-17 VITALS — BP 116/80 | HR 68 | Wt 231.0 lb

## 2015-12-17 DIAGNOSIS — Z89511 Acquired absence of right leg below knee: Secondary | ICD-10-CM

## 2015-12-17 DIAGNOSIS — M7712 Lateral epicondylitis, left elbow: Secondary | ICD-10-CM

## 2015-12-17 MED ORDER — TRIAMCINOLONE ACETONIDE 40 MG/ML IJ SUSP
40.0000 mg | Freq: Once | INTRAMUSCULAR | Status: AC
  Administered 2015-12-17: 10 mg via INTRAMUSCULAR

## 2015-12-17 MED ORDER — LIDOCAINE HCL 2 % IJ SOLN
10.0000 mL | Freq: Once | INTRAMUSCULAR | Status: AC
  Administered 2015-12-17: 0.75 mL via INTRADERMAL

## 2015-12-17 NOTE — Progress Notes (Signed)
Subjective:     Patient ID: Jon [name], male   DOB: 1965/06/21, 50 y.o.   MRN: [id number]  HPI Chief Complaint  Patient presents with  . left elbow pain    taking ibuprofen has happened before with rt and gotten a cortisone shot   Here for left elbow pain x 2 months.  No particular injury or trauma.  If he grabs a glass of water, has pain picking things up.  Hurts more in posterior elbow.  Had similar problems in the past with right arm.    Works as Naval architect.   Been a Dealer whole life, airplanes, cars.  Does use hands regular on the job.   Right handed.   Taking some ibuprofen daily 800mg  for months.  No swelling, no fever.  No other aggravating or relieving factors.   He came in yesterday to have script signed for updated supplies.  Has right BTK prosthesis.  Usually gets updated prosthesis every few years as technology changes.   Overall doing well, no particular c/o.  Exercise 3 days per week without problems.   Been getting equipment with Therapist, music.     Review of Systems As in subjective     Objective:   Physical Exam  BP 116/80   Pulse 68   Wt 231 lb (104.8 kg) Comment: prosthetic leg  BMI 30.48 kg/m   Gen: wd, wn, nad Skin : unremarkable, no erythema, no bruising, no warmth Tender over left lateral epicondyle.  Pain with resisted supination and grasping with hand Otherwise no swelling, no deformity, nontender otherwise, rest of left arm unremarkable UE pulses cap refill normal No edema Neuro: normal strength, sensation, DTRs of arms Right below knee amputation with prosthesis in place     Assessment:     Encounter Diagnoses  Name Primary?  . Left lateral epicondylitis Yes  . Status post below knee amputation of right lower extremity ([place])        Plan:     Discussed findings, symptoms.  He requests injection.   Offered PT referral, trial of stronger pain medication.   He wishes steroid injection instead since he has failed NSAID for  over a month now.      Procedure-steroid injection for tennis elbow, left arm: Discussed symptoms, concerns, treatment options, risks and benefits of steroid injection.   He gives consent.  Cleaned and prepped the left lateral epicondyle area in normal sterile fashion, used ethyl acetate spray for local anesthesia, injected 0.75 mL of 2% lidocaine without epi + 10mg  (0.35mL) of Kenalog 40mg /mL using a 25 gauge needle, 54mL total solution, in line with the cubital crease perpendicular to the lateral epicondylar facet and peppered the solution around the tendon.  Covered area with sterile bandage, and patient tolerated procedure well.     After care - discussed rest, use of arm sling, trying to keep arm in palm up position to rest the extensor tendons, avoid lifting, carrying or repetitive activity for the next 10 days at least.  Can use bag of frozen peas for cold therapy, continue Ibuprofen prn for pain and inflammation.  After a week, start using some ROM and stretching as discussed.  Recheck in 2 weeks.  . Signed order for prosthesis supplies.  No c/o, needs supplies to continue good function of prosthesis, avoid injury and tissue trauma.    Jon [name] was seen today for left elbow pain.  Diagnoses and all orders for this visit:  Left lateral epicondylitis -  lidocaine (XYLOCAINE) 2 % (with pres) injection 200 mg; Inject 10 mLs (200 mg total) into the skin once. -     triamcinolone acetonide (KENALOG-40) injection 40 mg; Inject 1 mL (40 mg total) into the muscle once.  Status post below knee amputation of right lower extremity ([place])

## 2016-02-28 ENCOUNTER — Other Ambulatory Visit: Payer: Self-pay | Admitting: Physician Assistant

## 2016-03-16 NOTE — Progress Notes (Signed)
Electrophysiology Office Note   Date:  03/17/2016   ID:  Jon [name], DOB [birth year], MRN [id number]  PCP:  Jon Oxford, PA-C  Primary Electrophysiologist:  Constance Haw, MD    Chief Complaint  Patient presents with  . Follow-up    PAF/TAFlutter     History of Present Illness: Jon [name] is a 49 y.o. male who presents today for electrophysiology evaluation.   He has a h/o aflutter ablation 06/14/15 and now off xarelto for chadsvasc score of 0. He had irregular heart beat on 2/28 for 10 hours. He went to AF clinic but was in [place] when he arrived.  He presented to the ER 08/09/15 in atrial fibrillation.  He broke to sinus rhythm but had many runs.  He was started on flecainide while in the emergency room. Has done well After starting the flecainide. He did have one episode of atrial fibrillation a couple days ago after drinking sweet tea. He took his dose of flecainide early and the atrial fibrillation resolved.    Today, he denies symptoms of chest pain, shortness of breath, orthopnea, PND, lower extremity edema, claudication, dizziness, presyncope, syncope, bleeding, or neurologic sequela. The patient is tolerating medications without difficulties and is otherwise without complaint today.    Past Medical History:  Diagnosis Date  . Atrial flutter ([place])    Started on Xarelto 05/14/2015, s/p TEE DCCV on 1/5  . Fatigue    "due to the flutter" (06/14/2015)  . Hyperlipidemia   . OSA on CPAP   . Paroxysmal a-fib [facility])    Past Surgical History:  Procedure Laterality Date  . ATRIAL FLUTTER ABLATION  06/14/2015  . BELOW KNEE LEG AMPUTATION Right 2004  . CARDIOVERSION N/A 05/23/2015   Procedure: CARDIOVERSION;  Surgeon: Sanda Klein, MD;  Location: [facility] ENDOSCOPY;  Service: Cardiovascular;  Laterality: N/A;  . ELECTROPHYSIOLOGIC STUDY N/A 06/14/2015   Procedure: A-Flutter Ablation;  Surgeon: Gabriele Zwilling [name], MD;  Location: [place] CV LAB;  Service:  Cardiovascular;  Laterality: N/A;  . TEE WITHOUT CARDIOVERSION N/A 05/23/2015   Procedure: TRANSESOPHAGEAL ECHOCARDIOGRAM (TEE);  Surgeon: Sanda Klein, MD;  Location: [facility] ENDOSCOPY;  Service: Cardiovascular;  Laterality: N/A;     Current Outpatient Prescriptions  Medication Sig Dispense Refill  . aspirin EC 81 MG tablet Take 81 mg by mouth daily.    . flecainide (TAMBOCOR) 50 MG tablet TAKE 1 AND 1/2 TABLETS BY MOUTH TWICE DAILY 270 tablet 2  . Magnesium 500 MG TABS Take 1 tablet by mouth daily.    . metoprolol tartrate (LOPRESSOR) 25 MG tablet Take 1 tablet (25 mg total) by mouth 2 (two) times daily. 180 tablet 3  . Multiple Vitamin (MULTIVITAMIN) tablet Take 1 tablet by mouth daily.      . Omega-3 Fatty Acids (FISH OIL PO) Take 2,400 mg by mouth daily.     No current facility-administered medications for this visit.     Allergies:   Review of patient's allergies indicates no known allergies.   Social History:  The patient  reports that he has never smoked. He has never used smokeless tobacco. He reports that he drinks about 13.2 oz of alcohol per week . He reports that he does not use drugs.   Family History:  The patient's family history includes Heart disease (age of onset: 35) in his brother; Heart disease (age of onset: 47) in his father.    ROS:  Please see the history of present illness.   Otherwise, review of  systems is positive for palpitations.   All other systems are reviewed and negative.    PHYSICAL EXAM: VS:  BP 112/80   Pulse (!) 59   Ht 6\' 1"  (1.854 m)   Wt 232 lb 4.8 oz (105.4 kg)   BMI 30.65 kg/m  , BMI Body mass index is 30.65 kg/m. GEN: Well nourished, well developed, in no acute distress  HEENT: normal  Neck: no JVD, carotid bruits, or masses Cardiac: RRR; no murmurs, rubs, or gallops,no edema  Respiratory:  clear to auscultation bilaterally, normal work of breathing GI: soft, nontender, nondistended, + BS MS: no deformity or atrophy  Skin: warm and  dry Neuro:  Strength and sensation are intact Psych: euthymic mood, full affect  EKG:  EKG is ordered today. The ekg ordered today shows sinus rhythm, rate 59  Recent Labs: 05/04/2015: Magnesium 1.9; TSH 1.632 05/21/2015: ALT 26 08/09/2015: BUN 8; Creatinine, Ser 0.84; Hemoglobin 15.5; Platelets 196; Potassium 4.0; Sodium 139    Lipid Panel     Component Value Date/Time   CHOL 185 05/22/2015 0130   TRIG 176 (H) 05/22/2015 0130   HDL 33 (L) 05/22/2015 0130   CHOLHDL 5.6 05/22/2015 0130   VLDL 35 05/22/2015 0130   LDLCALC 117 (H) 05/22/2015 0130     Wt Readings from Last 3 Encounters:  03/17/16 232 lb 4.8 oz (105.4 kg)  12/17/15 231 lb (104.8 kg)  10/18/15 228 lb (103.4 kg)      Other studies Reviewed: Additional studies/ records that were reviewed today include: TEE 05/23/15  Review of the above records today demonstrates:  - Left ventricle: The cavity size was normal. Wall thickness was  normal. Systolic function was normal. The estimated ejection  fraction was in the range of 55% to 60%. No evidence of thrombus. - Mitral valve: There was mild regurgitation directed centrally. - Left atrium: The atrium was mildly dilated. No evidence of  thrombus in the atrial cavity or appendage. No spontaneous echo  contrast was observed.  30 day monitor 08/07/15 Primary rhythm sinus Episodes of atrial fibrillation centered around 08/09/15 APCs and PVCs  ASSESSMENT AND PLAN:  1.  Atrial flutter: Has CHADS2VASc score of 0 an anticoagulation. Ablation 06/14/15.  No further flutter.  2. Paroxysmal atrial fibrillation: Doing well on flecainide and metoprolol. Has had one episode of atrial fibrillation since February. We'll continue him on his current medications. I did tell him that if he has more atrial fibrillation, ablation may be an option in the future.  This patients CHA2DS2-VASc Score and unadjusted Ischemic Stroke Rate (% per year) is equal to 0.2 % stroke rate/year from a  score of 0  Above score calculated as 1 point each if present [CHF, HTN, DM, Vascular=MI/PAD/Aortic Plaque, Age if 65-74, or Male] Above score calculated as 2 points each if present [Age > 75, or Stroke/TIA/TE]      Current medicines are reviewed at length with the patient today.   The patient does not have concerns regarding his medicines.  The following changes were made today:  none  Labs/ tests ordered today include:  Orders Placed This Encounter  Procedures  . EKG 12-Lead     Disposition:   FU with Deshonda Cryderman 6 months  Signed, Andres Vest [name], MD  03/17/2016 10:33 AM     [facility] [address] [place]  [place] [address] [phone] (office) [phone] (fax)

## 2016-03-17 ENCOUNTER — Encounter: Payer: Self-pay | Admitting: Cardiology

## 2016-03-17 ENCOUNTER — Ambulatory Visit (INDEPENDENT_AMBULATORY_CARE_PROVIDER_SITE_OTHER): Payer: Managed Care, Other (non HMO) | Admitting: Cardiology

## 2016-03-17 VITALS — BP 112/80 | HR 59 | Ht 73.0 in | Wt 232.3 lb

## 2016-03-17 DIAGNOSIS — I483 Typical atrial flutter: Secondary | ICD-10-CM | POA: Diagnosis not present

## 2016-03-17 DIAGNOSIS — I480 Paroxysmal atrial fibrillation: Secondary | ICD-10-CM | POA: Diagnosis not present

## 2016-03-17 NOTE — Patient Instructions (Signed)
Medication Instructions:  Your physician recommends that you continue on your current medications as directed. Please refer to the Current Medication list given to you today.  If you need a refill on your cardiac medications before your next appointment, please call your pharmacy.   Labwork: None ordered  Testing/Procedures: None ordered  Follow-Up: Your physician wants you to follow-up in: 6 months with Dr. Camnitz.  You will receive a reminder letter in the mail two months in advance. If you don't receive a letter, please call our office to schedule the follow-up appointment.  Thank you for choosing [facility]!!   Ashaun Gaughan, RN [phone]         

## 2016-03-26 ENCOUNTER — Ambulatory Visit (INDEPENDENT_AMBULATORY_CARE_PROVIDER_SITE_OTHER): Payer: Managed Care, Other (non HMO) | Admitting: Medical

## 2016-03-26 ENCOUNTER — Encounter: Payer: Self-pay | Admitting: Medical

## 2016-03-26 VITALS — BP 112/70 | HR 67 | Wt 233.8 lb

## 2016-03-26 DIAGNOSIS — M7712 Lateral epicondylitis, left elbow: Secondary | ICD-10-CM | POA: Diagnosis not present

## 2016-03-26 DIAGNOSIS — Z23 Encounter for immunization: Secondary | ICD-10-CM

## 2016-03-26 MED ORDER — HYDROCODONE-ACETAMINOPHEN 5-325 MG PO TABS: 1.0000 | ORAL_TABLET | Freq: Four times a day (QID) | ORAL | 0 refills | Status: DC | PRN

## 2016-03-26 MED ORDER — DICLOFENAC SODIUM 75 MG PO TBEC: 75.0000 mg | DELAYED_RELEASE_TABLET | Freq: Two times a day (BID) | ORAL | 0 refills | Status: DC

## 2016-03-26 NOTE — Patient Instructions (Signed)
Recommendation:  Try using a tennis elbow strap OTC, use this during the day  Use arm sling for an hour at a time, throughout the day if possible to rest the left arm  Use ice pack for 15-20 minutes a few times per day  Try to limit use of the left arm in general for the next 2 weeks  Begin Diclofenac anti-inflammatory twice daily with food for 1 week, then as needed  Use the Hydrocodone pain medications as needed for worse pain.  Caution - sedation  If not seeing significant improvement in the next 10-14 days, the next step would be referral to therapy or orthopedics.

## 2016-03-26 NOTE — Progress Notes (Signed)
  Subjective:     Patient ID: Jon [name], male   DOB: 1965-11-19, 50 y.o.   MRN: [id number]  HPI Chief Complaint  Patient presents with  . left elbow pain    left elbow pain , no swelling , having trouble picking things up    Here for left elbow pain x few weeks.  I saw him back in August for similar, and at that time he got a good response to steroid injection.  This relieved pain for several weeks, but in the last 2 weeks flaring back up again.   No particular injury or trauma.  Has pain in left elbow grasping things or using forearm in twisting motion to hold or move items.   Had similar problems in the past with right arm.    Works as Naval architect.   Been a Dealer whole life, airplanes, cars.  Does use hands regular on the job.   Right handed.   Taking some ibuprofen 800mg  BID for last week.  No swelling, no fever.  No other aggravating or relieving factors.   Review of Systems As in subjective     Objective:   Physical Exam  BP 112/70   Pulse 67   Wt 233 lb 12.8 oz (106.1 kg)   SpO2 98%   BMI 30.85 kg/m   Gen: wd, wn, nad Skin : unremarkable, no erythema, no bruising, no warmth Tender over left lateral epicondyle.  Pain with resisted supination and grasping with hand Otherwise no swelling, no deformity, nontender otherwise, rest of left arm unremarkable UE pulses cap refill normal No edema Neuro: normal strength, sensation, DTRs of arms     Assessment:     Encounter Diagnoses  Name Primary?  . Lateral epicondylitis of left elbow Yes  . Need for prophylactic vaccination and inoculation against influenza   . Needs flu shot        Plan:     Discussed findings, symptoms.    Patient Instructions  Recommendation:  Try using a tennis elbow strap OTC, use this during the day  Use arm sling for an hour at a time, throughout the day if possible to rest the left arm  Use ice pack for 15-20 minutes a few times per day  Try to limit use of the left arm  in general for the next 2 weeks  Begin Diclofenac anti-inflammatory twice daily with food for 1 week, then as needed  Use the Hydrocodone pain medications as needed for worse pain.  Caution - sedation  If not seeing significant improvement in the next 10-14 days, the next step would be referral to therapy or orthopedics.   Counseled on the influenza virus vaccine.  Vaccine information sheet given.  Influenza vaccine given after consent obtained.  Jon [name] was seen today for left elbow pain.  Diagnoses and all orders for this visit:  Lateral epicondylitis of left elbow  Need for prophylactic vaccination and inoculation against influenza  Needs flu shot -     Flu Vaccine QUAD 36+ mos IM  Other orders -     HYDROcodone-acetaminophen (NORCO/VICODIN) 5-325 MG tablet; Take 1 tablet by mouth every 6 (six) hours as needed for moderate pain. -     diclofenac (VOLTAREN) 75 MG EC tablet; Take 1 tablet (75 mg total) by mouth 2 (two) times daily.

## 2016-03-30 ENCOUNTER — Telehealth: Payer: Self-pay | Admitting: Medical

## 2016-03-30 NOTE — Telephone Encounter (Signed)
pls call [facility] and find out how often he is suppose to be getting new prosthesis.  It seems like I have signed off on new supplies q93mo, but I may be wrong.  Will insurance pay for new prosthetic every year?

## 2016-03-30 NOTE — Telephone Encounter (Signed)
Pt came in and dropped off a rx from [facility] for a new prosthetic. Sending back to be signed.

## 2016-03-30 NOTE — Telephone Encounter (Signed)
The rx is only is good for 3 months per [facility] . Pt dropped off form to be sign

## 2016-03-31 ENCOUNTER — Telehealth: Payer: Self-pay | Admitting: Family Medicine

## 2016-03-31 NOTE — Telephone Encounter (Signed)
Pt called to see if Audelia Acton had signed off on his prosthetic rx and Audelia Acton said it was on his desk and would be faxed today.  Pt aware.

## 2016-07-17 ENCOUNTER — Other Ambulatory Visit: Payer: Self-pay | Admitting: Physician Assistant

## 2016-07-17 NOTE — Telephone Encounter (Signed)
Refill Request.  

## 2016-08-26 ENCOUNTER — Encounter: Payer: Self-pay | Admitting: Cardiology

## 2016-09-14 ENCOUNTER — Encounter: Payer: Self-pay | Admitting: Cardiology

## 2016-09-14 ENCOUNTER — Ambulatory Visit (INDEPENDENT_AMBULATORY_CARE_PROVIDER_SITE_OTHER): Payer: Managed Care, Other (non HMO) | Admitting: Cardiology

## 2016-09-14 VITALS — BP 110/76 | HR 68 | Ht 73.0 in | Wt 237.0 lb

## 2016-09-14 DIAGNOSIS — I483 Typical atrial flutter: Secondary | ICD-10-CM | POA: Diagnosis not present

## 2016-09-14 DIAGNOSIS — I480 Paroxysmal atrial fibrillation: Secondary | ICD-10-CM

## 2016-09-14 NOTE — Progress Notes (Signed)
Electrophysiology Office Note   Date:  09/14/2016   ID:  Jon [name], DOB October 22, 1965, MRN [id number]  PCP:  Crisoforo Oxford, PA-C  Primary Electrophysiologist:  Constance Haw, MD    Chief Complaint  Patient presents with  . Follow-up    PAF/typical Aflutter     History of Present Illness: Jon [name] is a 51 y.o. male who presents today for electrophysiology evaluation.   He has a h/o aflutter ablation 06/14/15 and now off xarelto for chadsvasc score of 0. He had irregular heart beat on 2/28 for 10 hours. He went to AF clinic but was in [place] when he arrived.  He presented to the ER 08/09/15 in atrial fibrillation.  He was started on flecainide and has been in sinus rhythm since that time. He has had no issues with palpitations, shortness of breath, fatigue, or chest pain. He has been feeling well without major complaints, able to do all his daily activities.   Past Medical History:  Diagnosis Date  . Atrial flutter ([place])    Started on Xarelto 05/14/2015, s/p TEE DCCV on 1/5  . Fatigue    "due to the flutter" (06/14/2015)  . Hyperlipidemia   . OSA on CPAP   . Paroxysmal A-fib [facility])    Past Surgical History:  Procedure Laterality Date  . ATRIAL FLUTTER ABLATION  06/14/2015  . BELOW KNEE LEG AMPUTATION Right 2004  . CARDIOVERSION N/A 05/23/2015   Procedure: CARDIOVERSION;  Surgeon: Sanda Klein, MD;  Location: [facility] ENDOSCOPY;  Service: Cardiovascular;  Laterality: N/A;  . ELECTROPHYSIOLOGIC STUDY N/A 06/14/2015   Procedure: A-Flutter Ablation;  Surgeon: Hulen Mandler [name], MD;  Location: [place] CV LAB;  Service: Cardiovascular;  Laterality: N/A;  . TEE WITHOUT CARDIOVERSION N/A 05/23/2015   Procedure: TRANSESOPHAGEAL ECHOCARDIOGRAM (TEE);  Surgeon: Sanda Klein, MD;  Location: [facility] ENDOSCOPY;  Service: Cardiovascular;  Laterality: N/A;     Current Outpatient Prescriptions  Medication Sig Dispense Refill  . aspirin EC 81 MG tablet Take 81 mg by mouth daily.      . diclofenac (VOLTAREN) 75 MG EC tablet Take 1 tablet (75 mg total) by mouth 2 (two) times daily. 45 tablet 0  . flecainide (TAMBOCOR) 50 MG tablet TAKE 1 AND 1/2 TABLETS BY MOUTH TWICE DAILY 270 tablet 2  . HYDROcodone-acetaminophen (NORCO/VICODIN) 5-325 MG tablet Take 1 tablet by mouth every 6 (six) hours as needed for moderate pain. 20 tablet 0  . Magnesium 500 MG TABS Take 1 tablet by mouth daily.    . metoprolol tartrate (LOPRESSOR) 25 MG tablet TAKE 1 TABLET(25 MG) BY MOUTH TWICE DAILY 180 tablet 1  . Multiple Vitamin (MULTIVITAMIN) tablet Take 1 tablet by mouth daily.      . Omega-3 Fatty Acids (FISH OIL PO) Take 2,400 mg by mouth daily.     No current facility-administered medications for this visit.     Allergies:   Patient has no known allergies.   Social History:  The patient  reports that he has never smoked. He has never used smokeless tobacco. He reports that he drinks about 13.2 oz of alcohol per week . He reports that he does not use drugs.   Family History:  The patient's family history includes Heart disease (age of onset: 80) in his brother; Heart disease (age of onset: 66) in his father.    ROS:  Please see the history of present illness.   Otherwise, review of systems is positive for none.   All other systems  are reviewed and negative.    PHYSICAL EXAM: VS:  BP 110/76   Pulse 68   Ht 6\' 1"  (1.854 m)   Wt 237 lb (107.5 kg)   BMI 31.27 kg/m  , BMI Body mass index is 31.27 kg/m. GEN: Well nourished, well developed, in no acute distress  HEENT: normal  Neck: no JVD, carotid bruits, or masses Cardiac: RRR; no murmurs, rubs, or gallops,no edema  Respiratory:  clear to auscultation bilaterally, normal work of breathing GI: soft, nontender, nondistended, + BS MS: no deformity or atrophy  Skin: warm and dry Neuro:  Strength and sensation are intact Psych: euthymic mood, full affect   EKG:  EKG is ordered today. Review of the EKG ordered today shows sinus  rhythm, rate 68  Recent Labs: No results found for requested labs within last 8760 hours.    Lipid Panel     Component Value Date/Time   CHOL 185 05/22/2015 0130   TRIG 176 (H) 05/22/2015 0130   HDL 33 (L) 05/22/2015 0130   CHOLHDL 5.6 05/22/2015 0130   VLDL 35 05/22/2015 0130   LDLCALC 117 (H) 05/22/2015 0130     Wt Readings from Last 3 Encounters:  09/14/16 237 lb (107.5 kg)  03/26/16 233 lb 12.8 oz (106.1 kg)  03/17/16 232 lb 4.8 oz (105.4 kg)      Other studies Reviewed: Additional studies/ records that were reviewed today include: TEE 05/23/15  Review of the above records today demonstrates:  - Left ventricle: The cavity size was normal. Wall thickness was  normal. Systolic function was normal. The estimated ejection  fraction was in the range of 55% to 60%. No evidence of thrombus. - Mitral valve: There was mild regurgitation directed centrally. - Left atrium: The atrium was mildly dilated. No evidence of  thrombus in the atrial cavity or appendage. No spontaneous echo  contrast was observed.  30 day monitor 08/07/15 Primary rhythm sinus Episodes of atrial fibrillation centered around 08/09/15 APCs and PVCs  ASSESSMENT AND PLAN:  1.  Atrial flutter: Ablation 06/14/15. No further episodes of atrial flutter. Has a low stroke risk and thus is not anticoagulated.  2. Paroxysmal atrial fibrillation: Currently in sinus rhythm today on flecainide and metoprolol. Tolerating medications well without issue. He is not anticoagulated due to a low stroke risk. We'll continue current management.  This patients CHA2DS2-VASc Score and unadjusted Ischemic Stroke Rate (% per year) is equal to 0.2 % stroke rate/year from a score of 0  Above score calculated as 1 point each if present [CHF, HTN, DM, Vascular=MI/PAD/Aortic Plaque, Age if 65-74, or Male] Above score calculated as 2 points each if present [Age > 75, or Stroke/TIA/TE]      Current medicines are reviewed at  length with the patient today.   The patient does not have concerns regarding his medicines.  The following changes were made today:  none  Labs/ tests ordered today include:  Orders Placed This Encounter  Procedures  . EKG 12-Lead     Disposition:   FU with Maresha Anastos 12 months  Signed, Heinz Eckert [name], MD  09/14/2016 11:05 AM     [facility] [address] [place] [address] [phone] (office) [phone] (fax)

## 2017-03-07 ENCOUNTER — Other Ambulatory Visit: Payer: Self-pay | Admitting: Physician Assistant

## 2017-03-08 NOTE — Telephone Encounter (Signed)
Refill Request.  

## 2017-07-21 ENCOUNTER — Telehealth: Payer: Self-pay | Admitting: *Deleted

## 2017-07-21 NOTE — Telephone Encounter (Signed)
LM for pt to call and schedule CPE.

## 2017-09-07 ENCOUNTER — Ambulatory Visit: Payer: Managed Care, Other (non HMO) | Admitting: Cardiology

## 2017-10-06 ENCOUNTER — Ambulatory Visit: Payer: Managed Care, Other (non HMO) | Admitting: Cardiology

## 2017-10-06 ENCOUNTER — Encounter

## 2017-10-15 ENCOUNTER — Other Ambulatory Visit: Payer: Self-pay | Admitting: Physician Assistant

## 2017-10-15 ENCOUNTER — Encounter: Payer: Self-pay | Admitting: Medical

## 2017-10-15 ENCOUNTER — Ambulatory Visit (INDEPENDENT_AMBULATORY_CARE_PROVIDER_SITE_OTHER): Payer: Managed Care, Other (non HMO) | Admitting: Medical

## 2017-10-15 VITALS — BP 120/78 | HR 72 | Temp 97.7°F | Ht 73.0 in | Wt 234.8 lb

## 2017-10-15 DIAGNOSIS — M25472 Effusion, left ankle: Secondary | ICD-10-CM | POA: Diagnosis not present

## 2017-10-15 DIAGNOSIS — S93402A Sprain of unspecified ligament of left ankle, initial encounter: Secondary | ICD-10-CM | POA: Diagnosis not present

## 2017-10-15 NOTE — Progress Notes (Signed)
Subjective: Chief Complaint  Patient presents with  . Ankle Pain    swollen x4 days/ worse in the mornings    Here for left ankle swelling x 4 days.  Denies injury or trauma.  Was in the pool Sunday about 5 days ago, slipped in the pool but doesn't recall hurting his ankle .   No calve pain.   Just feels swelling in left ankle up to lower leg.   No color changes, no fever.  No other swelling or pain.  No CP, no SOB, no doe, no palpitations.  Otherwise been in normal state of health.  No other aggravating or relieving factors. No other complaint.  Past Medical History:  Diagnosis Date  . Atrial flutter ([place])    Started on Xarelto 05/14/2015, s/p TEE DCCV on 1/5  . Atrial flutter with rapid ventricular response ([place])   . Fatigue    "due to the flutter" (06/14/2015)  . Hyperlipidemia   . Hypertriglyceridemia 01/14/2011  . Need for prophylactic vaccination and inoculation against influenza 05/07/2015  . OSA on CPAP   . Paroxysmal A-fib ([place])   . Persistent atrial fibrillation ([place]) 05/07/2015  . Routine general medical examination at a health care facility 05/07/2015  . Status post below knee amputation of right lower extremity ([place]) 05/07/2015   Current Outpatient Medications on File Prior to Visit  Medication Sig Dispense Refill  . aspirin EC 81 MG tablet Take 81 mg by mouth daily.    . flecainide (TAMBOCOR) 50 MG tablet TAKE 1& 1/2 TABLETS BY MOUTH TWICE DAILY 270 tablet 1  . Magnesium 500 MG TABS Take 1 tablet by mouth daily.    . metoprolol tartrate (LOPRESSOR) 25 MG tablet TAKE 1 TABLET(25 MG) BY MOUTH TWICE DAILY 180 tablet 1  . Multiple Vitamin (MULTIVITAMIN) tablet Take 1 tablet by mouth daily.      . Omega-3 Fatty Acids (FISH OIL PO) Take 2,400 mg by mouth daily.     No current facility-administered medications on file prior to visit.    ROS as in subjective   Objective: BP 120/78   Pulse 72   Temp 97.7 F (36.5 C) (Oral)   Ht 6\' 1"  (1.854 m)   Wt 234 lb 12.8 oz (106.5  kg)   SpO2 95%   BMI 30.98 kg/m   Wt Readings from Last 3 Encounters:  10/15/17 234 lb 12.8 oz (106.5 kg)  09/14/16 237 lb (107.5 kg)  03/26/16 233 lb 12.8 oz (106.1 kg)   Gen: wd, wn, nad Skin: unremarkable Mild swelling of medial ankle and slight swelling distal left leg just above ankle.  Mild tenderness in ATFL region , mild pain with ROM which is full.  No laxity, no other tenderness.   No bruising or erythema Right lower leg prosthesis    Assessment: Encounter Diagnoses  Name Primary?  . Sprain of left ankle, unspecified ligament, initial encounter Yes  . Left ankle swelling     Plan Discussed findings, and advised relative rest, ice, elevation, short term OTC Aleve for this weekend.   Should gradually resolve.  If not much improved within 7-10 days, then call or recheck.

## 2017-10-19 ENCOUNTER — Other Ambulatory Visit: Payer: Self-pay | Admitting: Cardiology

## 2017-10-19 MED ORDER — METOPROLOL TARTRATE 25 MG PO TABS: ORAL_TABLET | ORAL | 0 refills | Status: DC

## 2017-11-02 ENCOUNTER — Encounter: Payer: Self-pay | Admitting: Cardiology

## 2017-11-02 ENCOUNTER — Ambulatory Visit (INDEPENDENT_AMBULATORY_CARE_PROVIDER_SITE_OTHER): Payer: Managed Care, Other (non HMO) | Admitting: Cardiology

## 2017-11-02 VITALS — BP 120/78 | HR 51 | Ht 73.0 in | Wt 235.8 lb

## 2017-11-02 DIAGNOSIS — I483 Typical atrial flutter: Secondary | ICD-10-CM | POA: Diagnosis not present

## 2017-11-02 DIAGNOSIS — I481 Persistent atrial fibrillation: Secondary | ICD-10-CM

## 2017-11-02 DIAGNOSIS — I4819 Other persistent atrial fibrillation: Secondary | ICD-10-CM

## 2017-11-02 NOTE — Progress Notes (Signed)
Electrophysiology Office Note   Date:  11/02/2017   ID:  Jon [name], DOB 1965-05-28, MRN [id number]  PCP:  Carlena Hurl, PA-C  Primary Electrophysiologist:  Constance Haw, MD    No chief complaint on file.    History of Present Illness: Jon [name] is a 52 y.o. male who presents today for electrophysiology evaluation.   He has a h/o aflutter ablation 06/14/15 and now off xarelto for chadsvasc score of 0. He had irregular heart beat on 2/28 for 10 hours. He went to AF clinic but was in [place] when he arrived.  He presented to the ER 08/09/15 in atrial fibrillation.  He was started on flecainide and has been in sinus rhythm since that time. He has had no issues with palpitations, shortness of breath, fatigue, or chest pain. He has been feeling well without major complaints, able to do all his daily activities.  Today, denies symptoms of palpitations, chest pain, shortness of breath, orthopnea, PND, lower extremity edema, claudication, dizziness, presyncope, syncope, bleeding, or neurologic sequela. The patient is tolerating medications without difficulties.  Overall he is feeling well.  He is having a little bit of fatigue that he attributes to some of his medications.  He has not noted any further episodes of atrial fibrillation.  Past Medical History:  Diagnosis Date  . Atrial flutter ([place])    Started on Xarelto 05/14/2015, s/p TEE DCCV on 1/5  . Atrial flutter with rapid ventricular response ([place])   . Fatigue    "due to the flutter" (06/14/2015)  . Hyperlipidemia   . Hypertriglyceridemia 01/14/2011  . Need for prophylactic vaccination and inoculation against influenza 05/07/2015  . OSA on CPAP   . Paroxysmal A-fib ([place])   . Persistent atrial fibrillation ([place]) 05/07/2015  . Routine general medical examination at a health care facility 05/07/2015  . Status post below knee amputation of right lower extremity ([place]) 05/07/2015   Past Surgical History:  Procedure  Laterality Date  . ATRIAL FLUTTER ABLATION  06/14/2015  . BELOW KNEE LEG AMPUTATION Right 2004  . CARDIOVERSION N/A 05/23/2015   Procedure: CARDIOVERSION;  Surgeon: Sanda Klein, MD;  Location: [facility] END[facility]COPY;  Service: Cardiovascular;  Laterality: N/A;  . ELECTROPHYSIOLOGIC STUDY N/A 06/14/2015   Procedure: A-Flutter Ablation;  Surgeon: Will [name], MD;  Location: [place] [facility];  Service: Cardiovascular;  Laterality: N/A;  . TEE WITHOUT CARDIOVERSION N/A 05/23/2015   Procedure: TRANSESOPHAGEAL ECHOCARDIOGRAM (TEE);  Surgeon: Sanda Klein, MD;  Location: [facility] ENDOSCOPY;  Service: Cardiovascular;  Laterality: N/A;     Current Outpatient Medications  Medication Sig Dispense Refill  . aspirin EC 81 MG tablet Take 81 mg by mouth daily.    . flecainide (TAMBOCOR) 50 MG tablet TAKE 1.5 TABLETS BY MOUTH TWICE DAILY 270 tablet 0  . Magnesium 500 MG TABS Take 1 tablet by mouth daily.    . metoprolol tartrate (LOPRESSOR) 25 MG tablet TAKE 1 TABLET(25 MG) BY MOUTH TWICE DAILY. Please keep upcoming appt with Dr. Curt Bears in June for future refills. Thank you 180 tablet 0  . Multiple Vitamin (MULTIVITAMIN) tablet Take 1 tablet by mouth daily.      . Omega-3 Fatty Acids (FISH OIL PO) Take 2,400 mg by mouth daily.     No current facility-administered medications for this visit.     Allergies:   Patient has no known allergies.   Social History:  The patient  reports that he has never smoked. He has never used smokeless tobacco. He  reports that he drinks about 13.2 oz of alcohol per week. He reports that he does not use drugs.   Family History:  The patient's family history includes Heart disease (age of onset: 45) in his brother; Heart disease (age of onset: 73) in his father.   ROS:  Please see the history of present illness.   Otherwise, review of systems is positive for none.   All other systems are reviewed and negative.   PHYSICAL EXAM: VS:  BP 120/78   Pulse (!) 51   Ht 6\' 1"  (1.854 m)    Wt 235 lb 12.8 oz (107 kg)   SpO2 95%   BMI 31.11 kg/m  , BMI Body mass index is 31.11 kg/m. GEN: Well nourished, well developed, in no acute distress  HEENT: normal  Neck: no JVD, carotid bruits, or masses Cardiac: RRR; no murmurs, rubs, or gallops,no edema  Respiratory:  clear to auscultation bilaterally, normal work of breathing GI: soft, nontender, nondistended, + BS MS: no deformity or atrophy  Skin: warm and dry Neuro:  Strength and sensation are intact Psych: euthymic mood, full affect  EKG:  EKG is ordered today. Personal review of the ekg ordered shows SR, rate 51   Recent Labs: No results found for requested labs within last 8760 hours.    Lipid Panel     Component Value Date/Time   CHOL 185 05/22/2015 0130   TRIG 176 (H) 05/22/2015 0130   HDL 33 (L) 05/22/2015 0130   CHOLHDL 5.6 05/22/2015 0130   VLDL 35 05/22/2015 0130   LDLCALC 117 (H) 05/22/2015 0130     Wt Readings from Last 3 Encounters:  11/02/17 235 lb 12.8 oz (107 kg)  10/15/17 234 lb 12.8 oz (106.5 kg)  09/14/16 237 lb (107.5 kg)      Other studies Reviewed: Additional studies/ records that were reviewed today include: TEE 05/23/15  Review of the above records today demonstrates:  - Left ventricle: The cavity size was normal. Wall thickness was  normal. Systolic function was normal. The estimated ejection  fraction was in the range of 55% to 60%. No evidence of thrombus. - Mitral valve: There was mild regurgitation directed centrally. - Left atrium: The atrium was mildly dilated. No evidence of  thrombus in the atrial cavity or appendage. No spontaneous echo  contrast was observed.  30 day monitor 08/07/15 Primary rhythm sinus Episodes of atrial fibrillation centered around 08/09/15 APCs and PVCs  ASSESSMENT AND PLAN:  1.  Atrial flutter: 06/14/2015.  Has had no further episodes of atrial fibrillation.  Not anticoagulated.  2. Paroxysmal atrial fibrillation: Early in sinus rhythm on  flecainide and metoprolol.  He is having some fatigue on his medications and would potentially like to come off of them.  I gave him information on ablation.  He will call us back with a decision.  This patients CHA2DS2-VASc Score and unadjusted Ischemic Stroke Rate (% per year) is equal to 0.2 % stroke rate/year from a score of 0  Above score calculated as 1 point each if present [CHF, HTN, DM, Vascular=MI/PAD/Aortic Plaque, Age if 65-74, or Male] Above score calculated as 2 points each if present [Age > 75, or Stroke/TIA/TE]    Current medicines are reviewed at length with the patient today.   The patient does not have concerns regarding his medicines.  The following changes were made today:  none  Labs/ tests ordered today include:  Orders Placed This Encounter  Procedures  . EKG 12-Lead  Disposition:   FU with Will Camnitz 12 months  Signed, Will [name], MD  11/02/2017 3:14 PM     [address] [place] [address] [phone] (office) [phone] (fax)

## 2017-11-02 NOTE — Patient Instructions (Signed)
Medication Instructions:  Your physician recommends that you continue on your current medications as directed. Please refer to the Current Medication list given to you today.  Labwork: None ordered  Testing/Procedures: None ordered  Follow-Up: Your physician wants you to follow-up in: 1 year with Dr. Curt Bears.  You will receive a reminder letter in the mail two months in advance. If you don't receive a letter, please call our office to schedule the follow-up appointment.   * If you need a refill on your cardiac medications before your next appointment, please call your pharmacy.   *Please note that any paperwork needing to be filled out by the provider will need to be addressed at the front desk prior to seeing the provider. Please note that any FMLA, disability or other documents regarding health condition is subject to a $25.00 charge that must be received prior to completion of paperwork in the form of a money order or check.  Thank you for choosing [facility]!!   Trinidad Curet, RN [phone]  Any Other Special Instructions Will Be Listed Below (If Applicable).  Cardiac Ablation Cardiac ablation is a procedure to disable (ablate) a small amount of heart tissue in very specific places. The heart has many electrical connections. Sometimes these connections are abnormal and can cause the heart to beat very fast or irregularly. Ablating some of the problem areas can improve the heart rhythm or return it to normal. Ablation may be done for people who:  Have Wolff-Parkinson-White syndrome.  Have fast heart rhythms (tachycardia).  Have taken medicines for an abnormal heart rhythm (arrhythmia) that were not effective or caused side effects.  Have a high-risk heartbeat that may be life-threatening.  During the procedure, a small incision is made in the neck or the groin, and a long, thin, flexible tube (catheter) is inserted into the incision and moved to the heart. Small devices  (electrodes) on the tip of the catheter will send out electrical currents. A type of X-ray (fluoroscopy) will be used to help guide the catheter and to provide images of the heart. Tell a health care provider about:  Any allergies you have.  All medicines you are taking, including vitamins, herbs, eye drops, creams, and over-the-counter medicines.  Any problems you or family members have had with anesthetic medicines.  Any blood disorders you have.  Any surgeries you have had.  Any medical conditions you have, such as kidney failure.  Whether you are pregnant or may be pregnant. What are the risks? Generally, this is a safe procedure. However, problems may occur, including:  Infection.  Bruising and bleeding at the catheter insertion site.  Bleeding into the chest, especially into the sac that surrounds the heart. This is a serious complication.  Stroke or blood clots.  Damage to other structures or organs.  Allergic reaction to medicines or dyes.  Need for a permanent pacemaker if the normal electrical system is damaged. A pacemaker is a small computer that sends electrical signals to the heart and helps your heart beat normally.  The procedure not being fully effective. This may not be recognized until months later. Repeat ablation procedures are sometimes required.  What happens before the procedure?  Follow instructions from your health care provider about eating or drinking restrictions.  Ask your health care provider about: ? Changing or stopping your regular medicines. This is especially important if you are taking diabetes medicines or blood thinners. ? Taking medicines such as aspirin and ibuprofen. These medicines can thin  your blood. Do not take these medicines before your procedure if your health care provider instructs you not to.  Plan to have someone take you home from the hospital or clinic.  If you will be going home right after the procedure, plan to  have someone with you for 24 hours. What happens during the procedure?  To lower your risk of infection: ? Your health care team will wash or sanitize their hands. ? Your skin will be washed with soap. ? Hair may be removed from the incision area.  An IV tube will be inserted into one of your veins.  You will be given a medicine to help you relax (sedative).  The skin on your neck or groin will be numbed.  An incision will be made in your neck or your groin.  A needle will be inserted through the incision and into a large vein in your neck or groin.  A catheter will be inserted into the needle and moved to your heart.  Dye may be injected through the catheter to help your surgeon see the area of the heart that needs treatment.  Electrical currents will be sent from the catheter to ablate heart tissue in desired areas. There are three types of energy that may be used to ablate heart tissue: ? Heat (radiofrequency energy). ? Laser energy. ? Extreme cold (cryoablation).  When the necessary tissue has been ablated, the catheter will be removed.  Pressure will be held on the catheter insertion area to prevent excessive bleeding.  A bandage (dressing) will be placed over the catheter insertion area. The procedure may vary among health care providers and hospitals. What happens after the procedure?  Your blood pressure, heart rate, breathing rate, and blood oxygen level will be monitored until the medicines you were given have worn off.  Your catheter insertion area will be monitored for bleeding. You will need to lie still for a few hours to ensure that you do not bleed from the catheter insertion area.  Do not drive for 24 hours or as long as directed by your health care provider. Summary  Cardiac ablation is a procedure to disable (ablate) a small amount of heart tissue in very specific places. Ablating some of the problem areas can improve the heart rhythm or return it to  normal.  During the procedure, electrical currents will be sent from the catheter to ablate heart tissue in desired areas. This information is not intended to replace advice given to you by your health care provider. Make sure you discuss any questions you have with your health care provider. Document Released: 09/20/2008 Document Revised: 03/23/2016 Document Reviewed: 03/23/2016 Elsevier Interactive Patient Education  [employer].

## 2018-01-21 ENCOUNTER — Other Ambulatory Visit: Payer: Self-pay | Admitting: Cardiology

## 2018-01-26 ENCOUNTER — Other Ambulatory Visit: Payer: Self-pay | Admitting: Cardiology

## 2018-03-02 ENCOUNTER — Encounter: Payer: Self-pay | Admitting: Medical

## 2018-03-02 ENCOUNTER — Ambulatory Visit (INDEPENDENT_AMBULATORY_CARE_PROVIDER_SITE_OTHER): Payer: Managed Care, Other (non HMO) | Admitting: Medical

## 2018-03-02 VITALS — BP 120/78 | HR 65 | Temp 97.5°F | Resp 16 | Ht 73.2 in | Wt 234.8 lb

## 2018-03-02 DIAGNOSIS — Z7189 Other specified counseling: Secondary | ICD-10-CM | POA: Diagnosis not present

## 2018-03-02 DIAGNOSIS — S8391XA Sprain of unspecified site of right knee, initial encounter: Secondary | ICD-10-CM | POA: Diagnosis not present

## 2018-03-02 DIAGNOSIS — Z1211 Encounter for screening for malignant neoplasm of colon: Secondary | ICD-10-CM | POA: Diagnosis not present

## 2018-03-02 DIAGNOSIS — S8001XA Contusion of right knee, initial encounter: Secondary | ICD-10-CM

## 2018-03-02 DIAGNOSIS — Z89511 Acquired absence of right leg below knee: Secondary | ICD-10-CM

## 2018-03-02 NOTE — Patient Instructions (Signed)
Consider returning soon for physical  You are due for prostate screen, colon cancer screen, and shingle vaccine  Let me know when ready for referral for colon screening.

## 2018-03-02 NOTE — Progress Notes (Signed)
Subjective: Chief Complaint  Patient presents with  . right knee    right knee injury X Friday   Here for right knee injury.  Date of injury February 25, 2018  He was at [facility] [employer] and slipped in some mud walking.  His legs went in different directions.  He ended up falling when he twisted his right knee.  He may have landed somewhat on his knee but he is not sure.  He has a right lower leg prosthesis from prior amputation below the knee.  He wants to make sure his right knee is okay.  He has had some mild swelling and pain on the inside of the right knee.  His prosthesis is rubbing on the area of pain.  He denies head injury or loss of consciousness or any other injury.  Had Flu shot at work, 02/06/18   Objective: BP 120/78   Pulse 65   Temp (!) 97.5 F (36.4 C) (Oral)   Resp 16   Ht 6' 1.2" (1.859 m)   Wt 234 lb 12.8 oz (106.5 kg)   SpO2 97%   BMI 30.81 kg/m   General: Well-developed well-nourished no acute distress Skin no erythema or bruising He has an amputation below the right knee midshaft of the lower leg Tender along the anterior medial right knee with slight puffy swelling, no obvious laxity, otherwise nontender, he does have pain with deep flexion of the right knee, negative for special test, right hip nontender normal range of motion, he has chronic tenderness of the distal stump Otherwise legs neurovascularly intact    Assessment: Encounter Diagnoses  Name Primary?  . Sprain of right knee, unspecified ligament, initial encounter Yes  . Contusion of right knee, initial encounter   . Counseling on health promotion and disease prevention   . Screen for colon cancer   . S/P below knee amputation, right ([facility])     Plan: Right knee sprain and contusion- over the next few days advised back to back cool and heat therapy, Tylenol as needed, relative rest, and symptoms should gradually improve over the next week.  Is not much improved within a  week then call or return.  No obvious sign of ACL or meniscus injury today.  He is not been here for a physical in several years.  Advised he return for a full physical and fasting labs and screenings.  He is past due for colonoscopy and other age-related screenings

## 2018-03-16 ENCOUNTER — Telehealth: Payer: Self-pay | Admitting: Medical

## 2018-03-16 NOTE — Telephone Encounter (Signed)
If he has not seen improvement since last visit, then it may be worthwhile referring to orthopedics given the situation.    Let me know

## 2018-03-16 NOTE — Telephone Encounter (Signed)
Pt dropped of form pt is in need of a new prosthetic leg, pt dropped of form of what he needs, in the bold letter is what he is needing on his file. Pt is coming in for a cpe on Nov the 19th, but wants this before then states his leg is hurting and he can barely walk, pt can be reached at [phone] gave letter to cyndi

## 2018-03-22 NOTE — Telephone Encounter (Signed)
Patient is coming in for appointment

## 2018-03-24 ENCOUNTER — Telehealth: Payer: Self-pay | Admitting: Medical

## 2018-03-24 NOTE — Telephone Encounter (Signed)
Please go ahead and schedule him for a physical as we discussed last visit.    At this visit we will do the documentation necessary to update the chart record.  I received some information to add to his next visit regarding his prosthesis request

## 2018-03-24 NOTE — Telephone Encounter (Signed)
Pt has a cpe 04/05/2018

## 2018-04-05 ENCOUNTER — Encounter: Payer: Self-pay | Admitting: Medical

## 2018-04-05 ENCOUNTER — Ambulatory Visit (INDEPENDENT_AMBULATORY_CARE_PROVIDER_SITE_OTHER): Payer: Managed Care, Other (non HMO) | Admitting: Medical

## 2018-04-05 ENCOUNTER — Telehealth: Payer: Self-pay | Admitting: Medical

## 2018-04-05 VITALS — BP 120/70 | HR 55 | Temp 97.9°F | Resp 16 | Ht 73.0 in | Wt 232.4 lb

## 2018-04-05 DIAGNOSIS — I480 Paroxysmal atrial fibrillation: Secondary | ICD-10-CM

## 2018-04-05 DIAGNOSIS — Z1211 Encounter for screening for malignant neoplasm of colon: Secondary | ICD-10-CM

## 2018-04-05 DIAGNOSIS — R7301 Impaired fasting glucose: Secondary | ICD-10-CM

## 2018-04-05 DIAGNOSIS — Z89511 Acquired absence of right leg below knee: Secondary | ICD-10-CM

## 2018-04-05 DIAGNOSIS — Z125 Encounter for screening for malignant neoplasm of prostate: Secondary | ICD-10-CM

## 2018-04-05 DIAGNOSIS — Z23 Encounter for immunization: Secondary | ICD-10-CM | POA: Diagnosis not present

## 2018-04-05 DIAGNOSIS — Z0000 Encounter for general adult medical examination without abnormal findings: Secondary | ICD-10-CM | POA: Diagnosis not present

## 2018-04-05 LAB — POCT URINALYSIS DIP (PROADVANTAGE DEVICE)
BILIRUBIN UA: NEGATIVE
BILIRUBIN UA: NEGATIVE mg/dL
Blood, UA: NEGATIVE
Glucose, UA: NEGATIVE mg/dL
LEUKOCYTES UA: NEGATIVE
Nitrite, UA: NEGATIVE
PH UA: 7.5 (ref 5.0–8.0)
Protein Ur, POC: NEGATIVE mg/dL
Specific Gravity, Urine: 1.01
Urobilinogen, Ur: NEGATIVE

## 2018-04-05 NOTE — Telephone Encounter (Signed)
See the mailing I got from [facility] regarding prosthesis.  Have them change future mailings to list me as the PCP for him.  They had listed Dr. Redmond School.  Let them know, I updated my chart today regarding need for updated prosthesis.  I saw him for a physical today.
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
## 2018-04-05 NOTE — Patient Instructions (Addendum)
Thanks for trusting [place] with your health care and for coming in for a physical today.  Below are some general recommendations I have for you:  Yearly screenings See your eye doctor yearly for routine vision care. See your dentist yearly for routine dental care including hygiene visits twice yearly. See me here yearly for a routine physical and preventative care visit   Specific Concerns today:  . Shingles vaccine:  I recommend you have a shingles vaccine to help prevent shingles or herpes zoster outbreak.   Please call your insurer to inquire about coverage for the Shingrix vaccine given in 2 doses.   Some insurers cover this vaccine after age 42, some cover this after age 40.  If your insurer covers this, then call to schedule appointment to have this vaccine here. . Also call insurance about coverage for Pneumococcal 23 vaccine for prevention of certain strains of pneumonia . Pending labs, we will consider either Colonoscopy or Cologard colon cancer screening.     Please follow up yearly for a physical.   Preventative Care for Adults - Male      [place]:  A routine yearly physical is a good way to check in with your primary care provider about your health and preventive screening. It is also an opportunity to share updates about your health and any concerns you have, and receive a thorough all-over exam.   Most health insurance companies pay for at least some preventative services.  Check with your health plan for specific coverages.  WHAT PREVENTATIVE SERVICES DO MEN NEED?  Adult men should have their weight and blood pressure checked regularly.   Men age 36 and older should have their cholesterol levels checked regularly.  Beginning at age 87 and continuing to age 25, men should be screened for colorectal cancer.  Certain people may need continued testing until age 35.  Updating vaccinations is part of preventative care.  Vaccinations help protect against  diseases such as the flu.  Osteoporosis is a disease in which the bones lose minerals and strength as we age. Men ages 78 and over should discuss this with their caregivers  Lab tests are generally done as part of preventative care to screen for anemia and blood disorders, to screen for problems with the kidneys and liver, to screen for bladder problems, to check blood sugar, and to check your cholesterol level.  Preventative services generally include counseling about diet, exercise, avoiding tobacco, drugs, excessive alcohol consumption, and sexually transmitted infections.    GENERAL RECOMMENDATIONS FOR GOOD HEALTH:  Healthy diet:  Eat a variety of foods, including fruit, vegetables, animal or vegetable protein, such as meat, fish, chicken, and eggs, or beans, lentils, tofu, and grains, such as rice.  Drink plenty of water daily.  Decrease saturated fat in the diet, avoid lots of red meat, processed foods, sweets, fast foods, and fried foods.  Exercise:  Aerobic exercise helps maintain good heart health. At least 30-40 minutes of moderate-intensity exercise is recommended. For example, a brisk walk that increases your heart rate and breathing. This should be done on most days of the week.   Find a type of exercise or a variety of exercises that you enjoy so that it becomes a part of your daily life.  Examples are running, walking, swimming, water aerobics, and biking.  For motivation and support, explore group exercise such as aerobic class, spin class, Zumba, Yoga,or  martial arts, etc.    Set exercise goals for yourself,  such as a certain weight goal, walk or run in a race such as a 5k walk/run.  Speak to your primary care provider about exercise goals.  Disease prevention:  If you smoke or chew tobacco, find out from your caregiver how to quit. It can literally save your life, no matter how long you have been a tobacco user. If you do not use tobacco, never begin.   Maintain a  healthy diet and normal weight. Increased weight leads to problems with blood pressure and diabetes.   The Body Mass Index or BMI is a way of measuring how much of your body is fat. Having a BMI above 27 increases the risk of heart disease, diabetes, hypertension, stroke and other problems related to obesity. Your caregiver can help determine your BMI and based on it develop an exercise and dietary program to help you achieve or maintain this important measurement at a healthful level.  High blood pressure causes heart and blood vessel problems.  Persistent high blood pressure should be treated with medicine if weight loss and exercise do not work.   Fat and cholesterol leaves deposits in your arteries that can block them. This causes heart disease and vessel disease elsewhere in your body.  If your cholesterol is found to be high, or if you have heart disease or certain other medical conditions, then you may need to have your cholesterol monitored frequently and be treated with medication.   Ask if you should have a cardiac stress test if your history suggests this. A stress test is a test done on a treadmill that looks for heart disease. This test can find disease prior to there being a problem.  Osteoporosis is a disease in which the bones lose minerals and strength as we age. This can result in serious bone fractures. Risk of osteoporosis can be identified using a bone density scan. Men ages 68 and over should discuss this with their caregivers. Ask your caregiver whether you should be taking a calcium supplement and Vitamin D, to reduce the rate of osteoporosis.   Avoid drinking alcohol in excess (more than two drinks per day).  Avoid use of street drugs. Do not share needles with anyone. Ask for professional help if you need assistance or instructions on stopping the use of alcohol, cigarettes, and/or drugs.  Brush your teeth twice a day with fluoride toothpaste, and floss once a day. Good oral  hygiene prevents tooth decay and gum disease. The problems can be painful, unattractive, and can cause other health problems. Visit your dentist for a routine oral and dental check up and preventive care every 6-12 months.   Look at your skin regularly.  Use a mirror to look at your back. Notify your caregivers of changes in moles, especially if there are changes in shapes, colors, a size larger than a pencil eraser, an irregular border, or development of new moles.  Safety:  Use seatbelts 100% of the time, whether driving or as a passenger.  Use safety devices such as hearing protection if you work in environments with loud noise or significant background noise.  Use safety glasses when doing any work that could send debris in to the eyes.  Use a helmet if you ride a bike or motorcycle.  Use appropriate safety gear for contact sports.  Talk to your caregiver about gun safety.  Use sunscreen with a SPF (or skin protection factor) of 15 or greater.  Lighter skinned people are at a greater risk of  skin cancer. Don't forget to also wear sunglasses in order to protect your eyes from too much damaging sunlight. Damaging sunlight can accelerate cataract formation.   Practice safe sex. Use condoms. Condoms are used for birth control and to help reduce the spread of sexually transmitted infections (or STIs).  Some of the STIs are gonorrhea (the clap), chlamydia, syphilis, trichomonas, herpes, HPV (human papilloma virus) and HIV (human immunodeficiency virus) which causes AIDS. The herpes, HIV and HPV are viral illnesses that have no cure. These can result in disability, cancer and death.   Keep carbon monoxide and smoke detectors in your home functioning at all times. Change the batteries every 6 months or use a model that plugs into the wall.   Vaccinations:  Stay up to date with your tetanus shots and other required immunizations. You should have a booster for tetanus every 10 years. Be sure to get your  flu shot every year, since 5%-20% of the [place] population comes down with the flu. The flu vaccine changes each year, so being vaccinated once is not enough. Get your shot in the fall, before the flu season peaks.   Other vaccines to consider:  Human Papilloma Virus or HPV causes cancer of the cervix, and other infections that can be transmitted from person to person. There is a vaccine for HPV, and males should get immunized between the ages of 41 and 93. It requires a series of 3 shots.   Pneumococcal vaccine to protect against certain types of pneumonia.  This is normally recommended for adults age 65 or older.  However, adults younger than 51 years old with certain underlying conditions such as diabetes, heart or lung disease should also receive the vaccine.  Shingles vaccine to protect against Varicella Zoster if you are older than age 76, or younger than 52 years old with certain underlying illness.  If you have not had the Shingrix vaccine, please call your insurer to inquire about coverage for the Shingrix vaccine given in 2 doses.   Some insurers cover this vaccine after age 11, some cover this after age 26.  If your insurer covers this, then call to schedule appointment to have this vaccine here  Hepatitis A vaccine to protect against a form of infection of the liver by a virus acquired from food.  Hepatitis B vaccine to protect against a form of infection of the liver by a virus acquired from blood or body fluids, particularly if you work in health care.  If you plan to travel internationally, check with your local health department for specific vaccination recommendations.   What should I know about cancer screening? Many types of cancers can be detected early and may often be prevented. Lung Cancer  You should be screened every year for lung cancer if: ? You are a current smoker who has smoked for at least 30 years. ? You are a former smoker who has quit within the past 15  years.  Talk to your health care provider about your screening options, when you should start screening, and how often you should be screened.  Colorectal Cancer  Routine colorectal cancer screening usually begins at 52 years of age and should be repeated every 5-10 years until you are 52 years old. You may need to be screened more often if early forms of precancerous polyps or small growths are found. Your health care provider may recommend screening at an earlier age if you have risk factors for colon cancer.  Your health  care provider may recommend using home test kits to check for hidden blood in the stool.  A small camera at the end of a tube can be used to examine your colon (sigmoidoscopy or colonoscopy). This checks for the earliest forms of colorectal cancer.  Prostate and Testicular Cancer  Depending on your age and overall health, your health care provider may do certain tests to screen for prostate and testicular cancer.  Talk to your health care provider about any symptoms or concerns you have about testicular or prostate cancer.  Skin Cancer  Check your skin from head to toe regularly.  Tell your health care provider about any new moles or changes in moles, especially if: ? There is a change in a mole's size, shape, or color. ? You have a mole that is larger than a pencil eraser.  Always use sunscreen. Apply sunscreen liberally and repeat throughout the day.  Protect yourself by wearing long sleeves, pants, a wide-brimmed hat, and sunglasses when outside.

## 2018-04-05 NOTE — Telephone Encounter (Signed)
Left message with answer service for [facility] to call back.

## 2018-04-06 ENCOUNTER — Other Ambulatory Visit: Payer: Self-pay | Admitting: Internal Medicine

## 2018-04-06 DIAGNOSIS — Z1211 Encounter for screening for malignant neoplasm of colon: Secondary | ICD-10-CM

## 2018-04-06 LAB — COMPREHENSIVE METABOLIC PANEL
ALT: 26 IU/L (ref 0–44)
AST: 24 IU/L (ref 0–40)
Albumin/Globulin Ratio: 1.6 (ref 1.2–2.2)
Albumin: 4.4 g/dL (ref 3.5–5.5)
Alkaline Phosphatase: 94 IU/L (ref 39–117)
BILIRUBIN TOTAL: 1.1 mg/dL (ref 0.0–1.2)
BUN/Creatinine Ratio: 12 (ref 9–20)
BUN: 11 mg/dL (ref 6–24)
CALCIUM: 9.5 mg/dL (ref 8.7–10.2)
CHLORIDE: 97 mmol/L (ref 96–106)
CO2: 25 mmol/L (ref 20–29)
Creatinine, Ser: 0.9 mg/dL (ref 0.76–1.27)
GFR calc non Af Amer: 98 mL/min/{1.73_m2} (ref >59)
GFR, EST AFRICAN AMERICAN: 113 mL/min/{1.73_m2} (ref >59)
GLUCOSE: 95 mg/dL (ref 65–99)
Globulin, Total: 2.7 g/dL (ref 1.5–4.5)
Potassium: 4.3 mmol/L (ref 3.5–5.2)
Sodium: 137 mmol/L (ref 134–144)
TOTAL PROTEIN: 7.1 g/dL (ref 6.0–8.5)

## 2018-04-06 LAB — HEMOGLOBIN A1C
Est. average glucose Bld gHb Est-mCnc: 120 mg/dL
Hgb A1c MFr Bld: 5.8 % — ABNORMAL HIGH (ref 4.8–5.6)

## 2018-04-06 LAB — LIPID PANEL
CHOL/HDL RATIO: 5.5 ratio — AB (ref 0.0–5.0)
Cholesterol, Total: 215 mg/dL — ABNORMAL HIGH (ref 100–199)
HDL: 39 mg/dL — ABNORMAL LOW (ref >39)
LDL CALC: 126 mg/dL — AB (ref 0–99)
TRIGLYCERIDES: 252 mg/dL — AB (ref 0–149)
VLDL Cholesterol Cal: 50 mg/dL — ABNORMAL HIGH (ref 5–40)

## 2018-04-06 LAB — CBC
HEMATOCRIT: 44.1 % (ref 37.5–51.0)
HEMOGLOBIN: 15.5 g/dL (ref 13.0–17.7)
MCH: 32.6 pg (ref 26.6–33.0)
MCHC: 35.1 g/dL (ref 31.5–35.7)
MCV: 93 fL (ref 79–97)
Platelets: 205 10*3/uL (ref 150–450)
RBC: 4.76 x10E6/uL (ref 4.14–5.80)
RDW: 12.3 % (ref 12.3–15.4)
WBC: 6.1 10*3/uL (ref 3.4–10.8)

## 2018-04-06 LAB — PSA: Prostate Specific Ag, Serum: 1 ng/mL (ref 0.0–4.0)

## 2018-04-06 NOTE — Telephone Encounter (Signed)
Please call [facility] back.  Do they have an order form or do they just need me to simply write "Prosthesis #1" on the prescription pad?

## 2018-04-06 NOTE — Telephone Encounter (Signed)
Jasmine from [facility] called and states they need a new order for new prosthetistic and the ov notes relating to why he needs a new one. Order can be faxed over.   [phone] Phone- [phone]

## 2018-04-07 NOTE — Telephone Encounter (Signed)
[facility] said it need to be on a prescription pad and faxed in

## 2018-04-08 NOTE — Telephone Encounter (Signed)
Send script.

## 2018-04-08 NOTE — Telephone Encounter (Signed)
Done KH 

## 2018-04-11 ENCOUNTER — Other Ambulatory Visit (INDEPENDENT_AMBULATORY_CARE_PROVIDER_SITE_OTHER): Payer: Managed Care, Other (non HMO)

## 2018-04-11 DIAGNOSIS — Z23 Encounter for immunization: Secondary | ICD-10-CM

## 2018-04-20 ENCOUNTER — Telehealth: Payer: Self-pay | Admitting: Medical

## 2018-04-20 NOTE — Telephone Encounter (Signed)
Please call [facility].  I received a certificate of necessity to complete from [facility].  I need additional information from them to complete the form.  First of all, its addressed to Dr. Redmond School although he sees me as PCP and I documented the necessity in my recent note. So please have them resend addressed to me.  Second, to help avoid duplicate work, what do they recommend I list for "medical necessity," "expected length of need" as well as "Instructions for use?"  We already called them once recently asking for an order form and were told all we need to do is write the order on script pad which we did.   I would prefer we make this a little easier as this is at least the 4th interaction we have had with [facility] regarding this one order.

## 2018-04-22 NOTE — Telephone Encounter (Signed)
Left message on voice mail  to call back

## 2018-04-25 NOTE — Telephone Encounter (Signed)
[facility] is correcting providers name and will fax another form to [place].   This is just [employer] formal RX with all the components of the prosthetic leg, all they need you to do is sign the form and send it back.

## 2018-04-29 NOTE — Telephone Encounter (Signed)
I'll have the form Monday for fax back.  I signed it.

## 2018-05-05 NOTE — Telephone Encounter (Signed)
Forms have been sent to [facility].

## 2018-07-03 LAB — COLOGUARD: COLOGUARD: NEGATIVE

## 2018-07-07 ENCOUNTER — Telehealth: Payer: Self-pay | Admitting: Medical

## 2018-07-07 NOTE — Telephone Encounter (Signed)
Please let them know that the Cologuard screening for colon cancer was negative.  This indicates a lower likelihood that colorectal cancer is present.   Lets plan to repeat this in 3 years.  However, if the develop bowel changes, blood in stool, unexpected weight loss, or other new bowel changes, then recheck. 

## 2018-07-08 ENCOUNTER — Encounter: Payer: Self-pay | Admitting: Medical

## 2018-07-08 NOTE — Telephone Encounter (Signed)
Pt was notified of results

## 2018-07-12 ENCOUNTER — Other Ambulatory Visit (INDEPENDENT_AMBULATORY_CARE_PROVIDER_SITE_OTHER): Payer: Managed Care, Other (non HMO)

## 2018-07-12 DIAGNOSIS — Z23 Encounter for immunization: Secondary | ICD-10-CM | POA: Diagnosis not present

## 2018-10-13 ENCOUNTER — Telehealth: Payer: Self-pay | Admitting: *Deleted

## 2018-10-13 NOTE — Telephone Encounter (Signed)

## 2018-10-21 ENCOUNTER — Other Ambulatory Visit: Payer: Self-pay

## 2018-10-21 ENCOUNTER — Telehealth (INDEPENDENT_AMBULATORY_CARE_PROVIDER_SITE_OTHER): Payer: Managed Care, Other (non HMO) | Admitting: Cardiology

## 2018-10-21 DIAGNOSIS — I480 Paroxysmal atrial fibrillation: Secondary | ICD-10-CM | POA: Diagnosis not present
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
## 2018-11-01 ENCOUNTER — Other Ambulatory Visit: Payer: Self-pay | Admitting: *Deleted

## 2018-11-01 MED ORDER — APIXABAN 5 MG PO TABS: 5.0000 mg | ORAL_TABLET | Freq: Two times a day (BID) | ORAL | 4 refills | Status: DC

## 2018-11-08 ENCOUNTER — Other Ambulatory Visit: Payer: Self-pay | Admitting: *Deleted

## 2018-11-08 DIAGNOSIS — I480 Paroxysmal atrial fibrillation: Secondary | ICD-10-CM

## 2018-11-21 ENCOUNTER — Telehealth: Payer: Self-pay | Admitting: *Deleted

## 2018-11-21 DIAGNOSIS — I480 Paroxysmal atrial fibrillation: Secondary | ICD-10-CM

## 2018-11-21 DIAGNOSIS — Z01812 Encounter for preprocedural laboratory examination: Secondary | ICD-10-CM

## 2018-11-21 NOTE — Telephone Encounter (Signed)
Reviewed ablation & CT instructions w/ pt. Pt confirms starting Eliquis 6/17. Labs scheduled for 7/7. CT scheduled for 7/8. Covid screening scheduled for 7/11 and instructions reviewed w/ pt. Post procedure f/u appts scheduled. Patient verbalized understanding and agreeable to plan.

## 2018-11-22 ENCOUNTER — Other Ambulatory Visit: Payer: Managed Care, Other (non HMO)

## 2018-11-22 ENCOUNTER — Other Ambulatory Visit: Payer: Self-pay

## 2018-11-22 DIAGNOSIS — Z01812 Encounter for preprocedural laboratory examination: Secondary | ICD-10-CM

## 2018-11-22 DIAGNOSIS — I480 Paroxysmal atrial fibrillation: Secondary | ICD-10-CM

## 2018-11-23 ENCOUNTER — Ambulatory Visit (HOSPITAL_COMMUNITY)
Admission: RE | Admit: 2018-11-23 | Discharge: 2018-11-23 | Disposition: A | Discharge status: Home / Self Care | Payer: Managed Care, Other (non HMO) | Source: Ambulatory Visit | Attending: Cardiology | Admitting: Cardiology

## 2018-11-23 DIAGNOSIS — I480 Paroxysmal atrial fibrillation: Secondary | ICD-10-CM

## 2018-11-23 LAB — BASIC METABOLIC PANEL
BUN/Creatinine Ratio: 16 (ref 9–20)
BUN: 15 mg/dL (ref 6–24)
CO2: 22 mmol/L (ref 20–29)
Calcium: 8.9 mg/dL (ref 8.7–10.2)
Chloride: 103 mmol/L (ref 96–106)
Creatinine, Ser: 0.94 mg/dL (ref 0.76–1.27)
GFR calc Af Amer: 107 mL/min/{1.73_m2} (ref >59)
GFR calc non Af Amer: 93 mL/min/{1.73_m2} (ref >59)
Glucose: 119 mg/dL — ABNORMAL HIGH (ref 65–99)
Potassium: 4.7 mmol/L (ref 3.5–5.2)
Sodium: 141 mmol/L (ref 134–144)

## 2018-11-23 LAB — CBC
Hematocrit: 45.7 % (ref 37.5–51.0)
Hemoglobin: 15.6 g/dL (ref 13.0–17.7)
MCH: 31.9 pg (ref 26.6–33.0)
MCHC: 34.1 g/dL (ref 31.5–35.7)
MCV: 94 fL (ref 79–97)
Platelets: 211 10*3/uL (ref 150–450)
RBC: 4.89 x10E6/uL (ref 4.14–5.80)
RDW: 12.6 % (ref 11.6–15.4)
WBC: 7 10*3/uL (ref 3.4–10.8)

## 2018-11-23 MED ORDER — IOHEXOL 350 MG/ML SOLN
80.0000 mL | Freq: Once | INTRAVENOUS | Status: AC | PRN
  Administered 2018-11-23: 80 mL via INTRAVENOUS

## 2018-11-26 ENCOUNTER — Other Ambulatory Visit (HOSPITAL_COMMUNITY)
Admission: RE | Admit: 2018-11-26 | Discharge: 2018-11-26 | Disposition: A | Discharge status: Home / Self Care | Payer: Managed Care, Other (non HMO) | Source: Ambulatory Visit | Attending: Cardiology | Admitting: Cardiology

## 2018-11-26 DIAGNOSIS — Z1159 Encounter for screening for other viral diseases: Secondary | ICD-10-CM | POA: Insufficient documentation

## 2018-11-26 DIAGNOSIS — Z01812 Encounter for preprocedural laboratory examination: Secondary | ICD-10-CM | POA: Diagnosis present

## 2018-11-26 LAB — SARS CORONAVIRUS 2 (TAT 6-24 HRS): SARS Coronavirus 2: NEGATIVE

## 2018-11-29 ENCOUNTER — Ambulatory Visit (HOSPITAL_COMMUNITY)
Admission: RE | Admit: 2018-11-29 | Discharge: 2018-11-29 | Disposition: A | Discharge status: Home / Self Care | Payer: Managed Care, Other (non HMO) | Attending: Cardiology | Admitting: Cardiology

## 2018-11-29 ENCOUNTER — Ambulatory Visit (HOSPITAL_COMMUNITY): Payer: Managed Care, Other (non HMO) | Admitting: Anesthesiology

## 2018-11-29 ENCOUNTER — Other Ambulatory Visit: Payer: Self-pay | Admitting: Cardiology

## 2018-11-29 ENCOUNTER — Encounter (HOSPITAL_COMMUNITY): Payer: Self-pay | Admitting: Anesthesiology

## 2018-11-29 ENCOUNTER — Encounter (HOSPITAL_COMMUNITY)
Admission: RE | Disposition: A | Discharge status: Home / Self Care | Payer: Self-pay | Source: Home / Self Care | Attending: Cardiology

## 2018-11-29 ENCOUNTER — Other Ambulatory Visit: Payer: Self-pay

## 2018-11-29 DIAGNOSIS — Z89511 Acquired absence of right leg below knee: Secondary | ICD-10-CM | POA: Diagnosis not present

## 2018-11-29 DIAGNOSIS — E785 Hyperlipidemia, unspecified: Secondary | ICD-10-CM | POA: Insufficient documentation

## 2018-11-29 DIAGNOSIS — I483 Typical atrial flutter: Secondary | ICD-10-CM | POA: Insufficient documentation

## 2018-11-29 DIAGNOSIS — I480 Paroxysmal atrial fibrillation: Secondary | ICD-10-CM | POA: Diagnosis not present

## 2018-11-29 DIAGNOSIS — G4733 Obstructive sleep apnea (adult) (pediatric): Secondary | ICD-10-CM | POA: Insufficient documentation

## 2018-11-29 HISTORY — PX: ATRIAL FIBRILLATION ABLATION: EP1191

## 2018-11-29 LAB — POCT ACTIVATED CLOTTING TIME
Activated Clotting Time: 252 seconds
Activated Clotting Time: 296 seconds
Activated Clotting Time: 169 seconds

## 2018-11-29 SURGERY — ATRIAL FIBRILLATION ABLATION
Anesthesia: General

## 2018-11-29 MED ORDER — OXYCODONE HCL 5 MG/5ML PO SOLN: 5.0000 mg | Freq: Once | ORAL | Status: DC | PRN

## 2018-11-29 MED ORDER — BUPIVACAINE HCL (PF) 0.25 % IJ SOLN
INTRAMUSCULAR | Status: DC | PRN
  Administered 2018-11-29: 20 mL

## 2018-11-29 MED ORDER — HEPARIN (PORCINE) IN NACL 2000-0.9 UNIT/L-% IV SOLN
INTRAVENOUS | Status: DC | PRN
  Administered 2018-11-29: 1000 mL

## 2018-11-29 MED ORDER — ROCURONIUM BROMIDE 10 MG/ML (PF) SYRINGE
PREFILLED_SYRINGE | INTRAVENOUS | Status: DC | PRN
  Administered 2018-11-29: 20 mg via INTRAVENOUS
  Administered 2018-11-29: 10 mg via INTRAVENOUS
  Administered 2018-11-29: 50 mg via INTRAVENOUS
  Administered 2018-11-29: 20 mg via INTRAVENOUS

## 2018-11-29 MED ORDER — SODIUM CHLORIDE 0.9 % IV SOLN: 250.0000 mL | INTRAVENOUS | Status: DC | PRN

## 2018-11-29 MED ORDER — SODIUM CHLORIDE 0.9% FLUSH: 3.0000 mL | INTRAVENOUS | Status: DC | PRN

## 2018-11-29 MED ORDER — LIDOCAINE 2% (20 MG/ML) 5 ML SYRINGE
INTRAMUSCULAR | Status: DC | PRN
  Administered 2018-11-29: 100 mg via INTRAVENOUS

## 2018-11-29 MED ORDER — MIDAZOLAM HCL 5 MG/5ML IJ SOLN
INTRAMUSCULAR | Status: DC | PRN
  Administered 2018-11-29: 2 mg via INTRAVENOUS

## 2018-11-29 MED ORDER — DOBUTAMINE IN D5W 4-5 MG/ML-% IV SOLN
INTRAVENOUS | Status: DC | PRN
  Administered 2018-11-29: 20 ug/kg/min via INTRAVENOUS

## 2018-11-29 MED ORDER — SODIUM CHLORIDE 0.9% FLUSH: 3.0000 mL | Freq: Two times a day (BID) | INTRAVENOUS | Status: DC

## 2018-11-29 MED ORDER — HEPARIN (PORCINE) IN NACL 1000-0.9 UT/500ML-% IV SOLN
INTRAVENOUS | Status: AC
  Filled 2018-11-29: qty 500

## 2018-11-29 MED ORDER — METOPROLOL TARTRATE 25 MG PO TABS: ORAL_TABLET | ORAL | 6 refills | Status: DC

## 2018-11-29 MED ORDER — DOBUTAMINE IN D5W 4-5 MG/ML-% IV SOLN
INTRAVENOUS | Status: AC
  Filled 2018-11-29: qty 250

## 2018-11-29 MED ORDER — FENTANYL CITRATE (PF) 100 MCG/2ML IJ SOLN: 25.0000 ug | INTRAMUSCULAR | Status: DC | PRN

## 2018-11-29 MED ORDER — ACETAMINOPHEN 325 MG PO TABS
650.0000 mg | ORAL_TABLET | ORAL | Status: DC | PRN
  Filled 2018-11-29: qty 2

## 2018-11-29 MED ORDER — HEPARIN SODIUM (PORCINE) 1000 UNIT/ML IJ SOLN
INTRAMUSCULAR | Status: DC | PRN
  Administered 2018-11-29: 4000 [IU] via INTRAVENOUS
  Administered 2018-11-29: 6000 [IU] via INTRAVENOUS
  Administered 2018-11-29: 15000 [IU] via INTRAVENOUS

## 2018-11-29 MED ORDER — HEPARIN (PORCINE) IN NACL 2000-0.9 UNIT/L-% IV SOLN
INTRAVENOUS | Status: AC
  Filled 2018-11-29: qty 1000

## 2018-11-29 MED ORDER — PROTAMINE SULFATE 10 MG/ML IV SOLN
INTRAVENOUS | Status: DC | PRN
  Administered 2018-11-29: 50 mg via INTRAVENOUS

## 2018-11-29 MED ORDER — FENTANYL CITRATE (PF) 100 MCG/2ML IJ SOLN
INTRAMUSCULAR | Status: DC | PRN
  Administered 2018-11-29: 100 ug via INTRAVENOUS

## 2018-11-29 MED ORDER — SODIUM CHLORIDE 0.9 % IV SOLN
INTRAVENOUS | Status: DC | PRN
  Administered 2018-11-29: 08:00:00 15 ug/min via INTRAVENOUS

## 2018-11-29 MED ORDER — BUPIVACAINE HCL (PF) 0.25 % IJ SOLN
INTRAMUSCULAR | Status: AC
  Filled 2018-11-29: qty 30

## 2018-11-29 MED ORDER — DEXAMETHASONE SODIUM PHOSPHATE 10 MG/ML IJ SOLN
INTRAMUSCULAR | Status: DC | PRN
  Administered 2018-11-29: 10 mg via INTRAVENOUS

## 2018-11-29 MED ORDER — ONDANSETRON HCL 4 MG/2ML IJ SOLN: 4.0000 mg | Freq: Four times a day (QID) | INTRAMUSCULAR | Status: DC | PRN

## 2018-11-29 MED ORDER — PROPOFOL 10 MG/ML IV BOLUS
INTRAVENOUS | Status: DC | PRN
  Administered 2018-11-29: 20 mg via INTRAVENOUS
  Administered 2018-11-29: 150 mg via INTRAVENOUS
  Administered 2018-11-29: 30 mg via INTRAVENOUS

## 2018-11-29 MED ORDER — APIXABAN 5 MG PO TABS: 5.0000 mg | ORAL_TABLET | Freq: Two times a day (BID) | ORAL | 6 refills | Status: DC

## 2018-11-29 MED ORDER — ONDANSETRON HCL 4 MG/2ML IJ SOLN
INTRAMUSCULAR | Status: DC | PRN
  Administered 2018-11-29: 4 mg via INTRAVENOUS

## 2018-11-29 MED ORDER — FLECAINIDE ACETATE 50 MG PO TABS: 75.0000 mg | ORAL_TABLET | Freq: Two times a day (BID) | ORAL | 6 refills | Status: DC

## 2018-11-29 MED ORDER — OXYCODONE HCL 5 MG PO TABS: 5.0000 mg | ORAL_TABLET | Freq: Once | ORAL | Status: DC | PRN

## 2018-11-29 MED ORDER — SUGAMMADEX SODIUM 200 MG/2ML IV SOLN
INTRAVENOUS | Status: DC | PRN
  Administered 2018-11-29: 200 mg via INTRAVENOUS

## 2018-11-29 MED ORDER — HEPARIN (PORCINE) IN NACL 1000-0.9 UT/500ML-% IV SOLN
INTRAVENOUS | Status: DC | PRN
  Administered 2018-11-29 (×2): 500 mL

## 2018-11-29 MED ORDER — ONDANSETRON HCL 4 MG/2ML IJ SOLN: 4.0000 mg | Freq: Once | INTRAMUSCULAR | Status: DC | PRN

## 2018-11-29 MED ORDER — SODIUM CHLORIDE 0.9 % IV SOLN
INTRAVENOUS | Status: DC
  Administered 2018-11-29 (×2): via INTRAVENOUS

## 2018-11-29 SURGICAL SUPPLY — 19 items
BLANKET WARM UNDERBOD FULL ACC (MISCELLANEOUS) ×2 IMPLANT
CATH MAPPNG PENTARAY F 2-6-2MM (CATHETERS) IMPLANT
CATH SMTCH THERMOCOOL SF DF (CATHETERS) ×2 IMPLANT
CATH SOUNDSTAR 3D IMAGING (CATHETERS) ×2 IMPLANT
CATH WEBSTER BI DIR CS D-F CRV (CATHETERS) ×2 IMPLANT
COVER SWIFTLINK CONNECTOR (BAG) ×2 IMPLANT
PACK EP LATEX FREE (CUSTOM PROCEDURE TRAY) ×2
PACK EP LF (CUSTOM PROCEDURE TRAY) ×1 IMPLANT
PAD PRO RADIOLUCENT 2001M-C (PAD) ×3 IMPLANT
PATCH CARTO3 (PAD) ×2 IMPLANT
PENTARAY F 2-6-2MM (CATHETERS) ×3
SHEATH AVANTI 11F 11CM (SHEATH) ×2 IMPLANT
SHEATH BAYLIS SUREFLEX  M 8.5 (SHEATH) ×4
SHEATH BAYLIS SUREFLEX M 8.5 (SHEATH) IMPLANT
SHEATH BAYLIS TRANSSEPTAL 98CM (NEEDLE) ×2 IMPLANT
SHEATH PINNACLE 7F 10CM (SHEATH) ×2 IMPLANT
SHEATH PINNACLE 8F 10CM (SHEATH) ×4 IMPLANT
SHEATH PINNACLE 9F 10CM (SHEATH) ×4 IMPLANT
TUBING SMART ABLATE COOLFLOW (TUBING) ×2 IMPLANT

## 2018-11-29 NOTE — Anesthesia Preprocedure Evaluation (Addendum)
Anesthesia Evaluation  Patient identified by MRN, date of birth, ID band Patient awake    Reviewed: Allergy & Precautions, NPO status , Patient's Chart, lab work & pertinent test results, reviewed documented beta blocker date and time   History of Anesthesia Complications Negative for: history of anesthetic complications  Airway Mallampati: IV  TM Distance: >3 FB Neck ROM: Full    Dental no notable dental hx. (+) Teeth Intact   Pulmonary sleep apnea ,    Pulmonary exam normal        Cardiovascular + dysrhythmias Atrial Fibrillation  Rhythm:Irregular Rate:Normal     Neuro/Psych negative neurological ROS  negative psych ROS   GI/Hepatic negative GI ROS, Neg liver ROS,   Endo/Other  negative endocrine ROS  Renal/GU negative Renal ROS  negative genitourinary   Musculoskeletal negative musculoskeletal ROS (+)   Abdominal   Peds  Hematology negative hematology ROS (+)   Anesthesia Other Findings Echo 2017: EF 55-60%, mild MR  Cardiac CT 11/23/18: Normal coronary origin. Right dominance. The study was performed without use of NTG and insufficient for plaque evaluation. Calcium score is 38 that corresponds to 75th percentile for age/sex. There appears to be only mild calcified plaque in the mid LAD with stenosis 25-49%.  Reproductive/Obstetrics                           Anesthesia Physical Anesthesia Plan  ASA: III  Anesthesia Plan: General   Post-op Pain Management:    Induction: Intravenous  PONV Risk Score and Plan: 2 and Ondansetron, Dexamethasone, Midazolam and Treatment may vary due to age or medical condition  Airway Management Planned: Oral ETT  Additional Equipment: None  Intra-op Plan:   Post-operative Plan: Extubation in OR  Informed Consent: I have reviewed the patients History and Physical, chart, labs and discussed the procedure including the risks, benefits and  alternatives for the proposed anesthesia with the patient or authorized representative who has indicated his/her understanding and acceptance.     Dental advisory given  Plan Discussed with:   Anesthesia Plan Comments:        Anesthesia Quick Evaluation
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
## 2018-11-29 NOTE — Discharge Instructions (Signed)
Post procedure care instructions No driving for 4 days. No lifting over 5 lbs for 1 week. No vigorous or sexual activity for 1 week. You may return to work on 12/06/2018. Keep procedure site clean & dry. If you notice increased pain, swelling, bleeding or pus, call/return!  You may shower, but no soaking baths/hot tubs/pools for 1 week. Restart eliquis tonight     Femoral Site Care This sheet gives you information about how to care for yourself after your procedure. Your health care provider may also give you more specific instructions. If you have problems or questions, contact your health care provider. What can I expect after the procedure? After the procedure, it is common to have:  Bruising that usually fades within 1-2 weeks.  Tenderness at the site. Follow these instructions at home: Wound care  Follow instructions from your health care provider about how to take care of your insertion site. Make sure you: ? Wash your hands with soap and water before you change your bandage (dressing). If soap and water are not available, use hand sanitizer. ? Change your dressing as told by your health care provider. ? Leave stitches (sutures), skin glue, or adhesive strips in place. These skin closures may need to stay in place for 2 weeks or longer. If adhesive strip edges start to loosen and curl up, you may trim the loose edges. Do not remove adhesive strips completely unless your health care provider tells you to do that.  Do not take baths, swim, or use a hot tub until your health care provider approves.  You may shower 24-48 hours after the procedure or as told by your health care provider. ? Gently wash the site with plain soap and water. ? Pat the area dry with a clean towel. ? Do not rub the site. This may cause bleeding.  Do not apply powder or lotion to the site. Keep the site clean and dry.  Check your femoral site every day for signs of infection. Check for: ? Redness, swelling, or  pain. ? Fluid or blood. ? Warmth. ? Pus or a bad smell. Activity  For the first 2-3 days after your procedure, or as long as directed: ? Avoid climbing stairs as much as possible. ? Do not squat.  Do not lift anything that is heavier than 10 lb (4.5 kg), or the limit that you are told, until your health care provider says that it is safe.  Rest as directed. ? Avoid sitting for a long time without moving. Get up to take short walks every 1-2 hours.  Do not drive for 24 hours if you were given a medicine to help you relax (sedative). General instructions  Take over-the-counter and prescription medicines only as told by your health care provider.  Keep all follow-up visits as told by your health care provider. This is important. Contact a health care provider if you have:  A fever or chills.  You have redness, swelling, or pain around your insertion site. Get help right away if:  The catheter insertion area swells very fast.  You pass out.  You suddenly start to sweat or your skin gets clammy.  The catheter insertion area is bleeding, and the bleeding does not stop when you hold steady pressure on the area.  The area near or just beyond the catheter insertion site becomes pale, cool, tingly, or numb. These symptoms may represent a serious problem that is an emergency. Do not wait to see if the symptoms  will go away. Get medical help right away. Call your local emergency services (911 in the [place] Do not drive yourself to the hospital. Summary  After the procedure, it is common to have bruising that usually fades within 1-2 weeks.  Check your femoral site every day for signs of infection.  Do not lift anything that is heavier than 10 lb (4.5 kg), or the limit that you are told, until your health care provider says that it is safe. This information is not intended to replace advice given to you by your health care provider. Make sure you discuss any questions you have with  your health care provider. Document Released: 01/05/2014 Document Revised: 05/17/2017 Document Reviewed: 05/17/2017 Elsevier Patient Education  2020 [employer].

## 2018-11-29 NOTE — Anesthesia Postprocedure Evaluation (Signed)
Anesthesia Post Note  Patient: Jon [name]  Procedure(s) Performed: ATRIAL FIBRILLATION ABLATION (N/A )     Patient location during evaluation: PACU Anesthesia Type: General Level of consciousness: awake and alert Pain management: pain level controlled Vital Signs Assessment: post-procedure vital signs reviewed and stable Respiratory status: spontaneous breathing, nonlabored ventilation and respiratory function stable Cardiovascular status: blood pressure returned to baseline and stable Postop Assessment: no apparent nausea or vomiting Anesthetic complications: no    Last Vitals:  Vitals:   11/29/18 1400 11/29/18 1405  BP: 137/67 137/67  Pulse: 72 72  Resp: 16 17  Temp:    SpO2: 93% 94%    Last Pain:  Vitals:   11/29/18 1443  TempSrc:   PainSc: 0-No pain                 Lidia Collum

## 2018-11-29 NOTE — Progress Notes (Signed)
Site area: Right groin a 7 and 9 french venous sheath was removed  Site Prior to Removal:  Level 0  Pressure Applied For 20 MINUTES    Bedrest Beginning at   Manual:   Yes.    Patient Status During Pull:  stable  Post Pull Groin Site:  Level 0  Post Pull Instructions Given:  Yes.    Post Pull Pulses Present:  Yes.    Dressing Applied:  Yes.    Comments:

## 2018-11-29 NOTE — Progress Notes (Signed)
Site area: Left groin a 7 an 11 french venous sheath was removed  Site Prior to Removal:  Level 0  Pressure Applied For 20 MINUTES    Bedrest Beginning at   Manual:   Yes.    Patient Status During Pull:  stable  Post Pull Groin Site:  Level 0  Post Pull Instructions Given:  Yes.    Post Pull Pulses Present:  Yes.    Dressing Applied:  Yes.    Comments:

## 2018-11-29 NOTE — Transfer of Care (Signed)
Immediate Anesthesia Transfer of Care Note  Patient: Youcef Klas  Procedure(s) Performed: ATRIAL FIBRILLATION ABLATION (N/A )  Patient Location: Cath Lab  Anesthesia Type:General  Level of Consciousness: awake, alert  and oriented  Airway & Oxygen Therapy: Patient Spontanous Breathing and Patient connected to nasal cannula oxygen  Post-op Assessment: Report given to RN and Post -op Vital signs reviewed and stable  Post vital signs: Reviewed and stable  Last Vitals:  Vitals Value Taken Time  BP 126/66 11/29/18 1024  Temp    Pulse 61 11/29/18 1026  Resp 15 11/29/18 1026  SpO2 100 % 11/29/18 1026  Vitals shown include unvalidated device data.  Last Pain:  Vitals:   11/29/18 1025  TempSrc:   PainSc: 0-No pain      Patients Stated Pain Goal: 2 (79/03/83 3383)  Complications: No apparent anesthesia complications

## 2018-11-29 NOTE — Anesthesia Procedure Notes (Addendum)
Procedure Name: Intubation Date/Time: 11/29/2018 7:47 AM Performed by: Kyung Rudd, CRNA Pre-anesthesia Checklist: Patient identified, Emergency Drugs available, Suction available and Patient being monitored Patient Re-evaluated:Patient Re-evaluated prior to induction Oxygen Delivery Method: Circle system utilized Preoxygenation: Pre-oxygenation with 100% oxygen Induction Type: IV induction Ventilation: Mask ventilation without difficulty, Two handed mask ventilation required and Oral airway inserted - appropriate to patient size Laryngoscope Size: Glidescope and 4 Tube type: Oral Tube size: 7.5 mm Number of attempts: 2 Airway Equipment and Method: Stylet and Video-laryngoscopy Placement Confirmation: ETT inserted through vocal cords under direct vision,  positive ETCO2 and breath sounds checked- equal and bilateral Secured at: 22 cm Tube secured with: Tape Dental Injury: Teeth and Oropharynx as per pre-operative assessment  Comments: DLx1 with MAC 4, unable to visualize vocal cords. 2nd DL with Glidescope Go successful. +ETCO2 & BBS=.

## 2018-11-30 ENCOUNTER — Encounter (HOSPITAL_COMMUNITY): Payer: Self-pay | Admitting: Cardiology

## 2018-12-30 ENCOUNTER — Ambulatory Visit (HOSPITAL_COMMUNITY)
Admission: RE | Admit: 2018-12-30 | Discharge: 2018-12-30 | Disposition: A | Discharge status: Home / Self Care | Payer: Managed Care, Other (non HMO) | Source: Ambulatory Visit | Attending: Physician Assistant | Admitting: Physician Assistant

## 2018-12-30 ENCOUNTER — Other Ambulatory Visit: Payer: Self-pay

## 2018-12-30 ENCOUNTER — Encounter (HOSPITAL_COMMUNITY): Payer: Self-pay | Admitting: Physician Assistant

## 2018-12-30 VITALS — BP 118/64 | HR 60 | Ht 73.0 in | Wt 237.0 lb

## 2018-12-30 DIAGNOSIS — I4892 Unspecified atrial flutter: Secondary | ICD-10-CM | POA: Insufficient documentation

## 2018-12-30 DIAGNOSIS — E785 Hyperlipidemia, unspecified: Secondary | ICD-10-CM | POA: Insufficient documentation

## 2018-12-30 DIAGNOSIS — E669 Obesity, unspecified: Secondary | ICD-10-CM | POA: Diagnosis not present

## 2018-12-30 DIAGNOSIS — I480 Paroxysmal atrial fibrillation: Secondary | ICD-10-CM | POA: Diagnosis not present

## 2018-12-30 DIAGNOSIS — Z8249 Family history of ischemic heart disease and other diseases of the circulatory system: Secondary | ICD-10-CM | POA: Diagnosis not present

## 2018-12-30 DIAGNOSIS — Z89511 Acquired absence of right leg below knee: Secondary | ICD-10-CM | POA: Insufficient documentation

## 2018-12-30 DIAGNOSIS — Z7901 Long term (current) use of anticoagulants: Secondary | ICD-10-CM | POA: Diagnosis not present

## 2018-12-30 DIAGNOSIS — Z79899 Other long term (current) drug therapy: Secondary | ICD-10-CM | POA: Insufficient documentation

## 2018-12-30 DIAGNOSIS — Z6831 Body mass index (BMI) 31.0-31.9, adult: Secondary | ICD-10-CM | POA: Diagnosis not present

## 2018-12-30 DIAGNOSIS — E781 Pure hyperglyceridemia: Secondary | ICD-10-CM | POA: Diagnosis not present
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
## 2019-03-06 ENCOUNTER — Ambulatory Visit: Payer: Managed Care, Other (non HMO) | Admitting: Cardiology
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
## 2019-05-10 ENCOUNTER — Ambulatory Visit (INDEPENDENT_AMBULATORY_CARE_PROVIDER_SITE_OTHER): Payer: Managed Care, Other (non HMO) | Admitting: Cardiology

## 2019-05-10 ENCOUNTER — Other Ambulatory Visit: Payer: Self-pay

## 2019-05-10 ENCOUNTER — Encounter: Payer: Self-pay | Admitting: Cardiology

## 2019-05-10 VITALS — BP 120/80 | HR 69 | Ht 73.0 in | Wt 239.0 lb

## 2019-05-10 DIAGNOSIS — I480 Paroxysmal atrial fibrillation: Secondary | ICD-10-CM

## 2019-05-10 MED ORDER — FLECAINIDE ACETATE 100 MG PO TABS: 100.0000 mg | ORAL_TABLET | Freq: Two times a day (BID) | ORAL | 6 refills | Status: DC

## 2019-05-10 NOTE — Addendum Note (Signed)
Addended by: Stanton Kidney on: 05/10/2019 08:35 AM   Modules accepted: Orders
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
## 2019-05-10 NOTE — Patient Instructions (Addendum)
Medication Instructions:  Your physician has recommended you make the following change in your medication:  1. INCREASE Flecainide to 100 mg twice a day 2. STOP Eliquis  * If you need a refill on your cardiac medications before your next appointment, please call your pharmacy.   Labwork: None ordered  Testing/Procedures: None ordered  Follow-Up: At [facility], you and your health needs are our priority.  As part of our continuing mission to provide you with exceptional heart care, we have created designated Provider Care Teams.  These Care Teams include your primary Cardiologist (physician) and Advanced Practice Providers (APPs -  Physician Assistants and Nurse Practitioners) who all work together to provide you with the care you need, when you need it.  You will need a follow up appointment in 6 months.  Please call our office 2 months in advance to schedule this appointment.  You may see Dr Curt Bears or one of the following Advanced Practice Providers on your designated Care Team:    Chanetta [place], NP  Tommye Standard, PA-C  Oda Kilts, [place]  Thank you for choosing [facility]!!   Trinidad Curet, RN [phone]

## 2019-05-10 NOTE — Addendum Note (Signed)
Addended by: Georgiann Cocker on: 05/10/2019 08:39 AM   Modules accepted: Orders

## 2019-05-24 ENCOUNTER — Other Ambulatory Visit: Payer: Self-pay

## 2019-05-24 ENCOUNTER — Ambulatory Visit (INDEPENDENT_AMBULATORY_CARE_PROVIDER_SITE_OTHER): Payer: Managed Care, Other (non HMO) | Admitting: Medical

## 2019-05-24 ENCOUNTER — Encounter: Payer: Self-pay | Admitting: Medical

## 2019-05-24 VITALS — BP 100/72 | HR 66 | Temp 97.6°F | Ht 73.0 in | Wt 240.4 lb

## 2019-05-24 DIAGNOSIS — Z125 Encounter for screening for malignant neoplasm of prostate: Secondary | ICD-10-CM

## 2019-05-24 DIAGNOSIS — Z23 Encounter for immunization: Secondary | ICD-10-CM | POA: Diagnosis not present

## 2019-05-24 DIAGNOSIS — Z89511 Acquired absence of right leg below knee: Secondary | ICD-10-CM

## 2019-05-24 DIAGNOSIS — H9313 Tinnitus, bilateral: Secondary | ICD-10-CM | POA: Diagnosis not present

## 2019-05-24 DIAGNOSIS — Z0000 Encounter for general adult medical examination without abnormal findings: Secondary | ICD-10-CM | POA: Diagnosis not present

## 2019-05-24 DIAGNOSIS — E781 Pure hyperglyceridemia: Secondary | ICD-10-CM

## 2019-05-24 DIAGNOSIS — R7301 Impaired fasting glucose: Secondary | ICD-10-CM

## 2019-05-24 DIAGNOSIS — I480 Paroxysmal atrial fibrillation: Secondary | ICD-10-CM
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
## 2019-05-25 DIAGNOSIS — H9313 Tinnitus, bilateral: Secondary | ICD-10-CM | POA: Insufficient documentation

## 2019-05-31 ENCOUNTER — Ambulatory Visit: Payer: Managed Care, Other (non HMO)

## 2019-05-31 ENCOUNTER — Other Ambulatory Visit: Payer: Self-pay

## 2019-05-31 DIAGNOSIS — E781 Pure hyperglyceridemia: Secondary | ICD-10-CM

## 2019-05-31 DIAGNOSIS — R7301 Impaired fasting glucose: Secondary | ICD-10-CM

## 2019-05-31 DIAGNOSIS — Z125 Encounter for screening for malignant neoplasm of prostate: Secondary | ICD-10-CM

## 2019-05-31 DIAGNOSIS — Z0000 Encounter for general adult medical examination without abnormal findings: Secondary | ICD-10-CM

## 2019-06-01 LAB — LIPID PANEL
Chol/HDL Ratio: 4.9 ratio (ref 0.0–5.0)
Cholesterol, Total: 200 mg/dL — ABNORMAL HIGH (ref 100–199)
HDL: 41 mg/dL (ref >39)
LDL Chol Calc (NIH): 127 mg/dL — ABNORMAL HIGH (ref 0–99)
Triglycerides: 180 mg/dL — ABNORMAL HIGH (ref 0–149)
VLDL Cholesterol Cal: 32 mg/dL (ref 5–40)

## 2019-06-01 LAB — COMPREHENSIVE METABOLIC PANEL WITH GFR
ALT: 70 IU/L — ABNORMAL HIGH (ref 0–44)
AST: 56 IU/L — ABNORMAL HIGH (ref 0–40)
Albumin/Globulin Ratio: 1.8 (ref 1.2–2.2)
Albumin: 4.6 g/dL (ref 3.8–4.9)
Alkaline Phosphatase: 113 IU/L (ref 39–117)
BUN/Creatinine Ratio: 10 (ref 9–20)
BUN: 9 mg/dL (ref 6–24)
Bilirubin Total: 0.6 mg/dL (ref 0.0–1.2)
CO2: 24 mmol/L (ref 20–29)
Calcium: 9.4 mg/dL (ref 8.7–10.2)
Chloride: 103 mmol/L (ref 96–106)
Creatinine, Ser: 0.9 mg/dL (ref 0.76–1.27)
GFR calc Af Amer: 112 mL/min/1.73
GFR calc non Af Amer: 97 mL/min/1.73
Globulin, Total: 2.5 g/dL (ref 1.5–4.5)
Glucose: 134 mg/dL — ABNORMAL HIGH (ref 65–99)
Potassium: 5 mmol/L (ref 3.5–5.2)
Sodium: 142 mmol/L (ref 134–144)
Total Protein: 7.1 g/dL (ref 6.0–8.5)

## 2019-06-01 LAB — CBC WITH DIFFERENTIAL/PLATELET
Basophils Absolute: 0.1 10*3/uL (ref 0.0–0.2)
Basos: 1 %
EOS (ABSOLUTE): 0.2 10*3/uL (ref 0.0–0.4)
Eos: 4 %
Hematocrit: 46.9 % (ref 37.5–51.0)
Hemoglobin: 16.2 g/dL (ref 13.0–17.7)
Immature Grans (Abs): 0 10*3/uL (ref 0.0–0.1)
Immature Granulocytes: 0 %
Lymphocytes Absolute: 1.4 10*3/uL (ref 0.7–3.1)
Lymphs: 25 %
MCH: 32.8 pg (ref 26.6–33.0)
MCHC: 34.5 g/dL (ref 31.5–35.7)
MCV: 95 fL (ref 79–97)
Monocytes Absolute: 0.5 10*3/uL (ref 0.1–0.9)
Monocytes: 9 %
Neutrophils Absolute: 3.4 10*3/uL (ref 1.4–7.0)
Neutrophils: 61 %
Platelets: 212 10*3/uL (ref 150–450)
RBC: 4.94 x10E6/uL (ref 4.14–5.80)
RDW: 12.2 % (ref 11.6–15.4)
WBC: 5.7 10*3/uL (ref 3.4–10.8)

## 2019-06-01 LAB — HEMOGLOBIN A1C
Est. average glucose Bld gHb Est-mCnc: 126 mg/dL
Hgb A1c MFr Bld: 6 % — ABNORMAL HIGH (ref 4.8–5.6)

## 2019-06-01 LAB — PSA: Prostate Specific Ag, Serum: 1 ng/mL (ref 0.0–4.0)

## 2019-06-06 ENCOUNTER — Other Ambulatory Visit: Payer: Self-pay | Admitting: Medical

## 2019-06-06 DIAGNOSIS — R7989 Other specified abnormal findings of blood chemistry: Secondary | ICD-10-CM

## 2019-08-20 DIAGNOSIS — J069 Acute upper respiratory infection, unspecified: Secondary | ICD-10-CM | POA: Diagnosis not present

## 2019-08-20 DIAGNOSIS — Z03818 Encounter for observation for suspected exposure to other biological agents ruled out: Secondary | ICD-10-CM | POA: Diagnosis not present

## 2019-08-23 DIAGNOSIS — U071 COVID-19: Secondary | ICD-10-CM | POA: Diagnosis not present

## 2019-09-02 ENCOUNTER — Other Ambulatory Visit: Payer: Self-pay | Admitting: Physician Assistant

## 2019-10-04 ENCOUNTER — Encounter: Payer: Self-pay | Admitting: Medical

## 2019-10-04 ENCOUNTER — Ambulatory Visit (INDEPENDENT_AMBULATORY_CARE_PROVIDER_SITE_OTHER): Payer: BC Managed Care – PPO | Admitting: Medical

## 2019-10-04 ENCOUNTER — Other Ambulatory Visit: Payer: Self-pay

## 2019-10-04 VITALS — BP 128/80 | HR 63 | Ht 73.0 in | Wt 235.0 lb

## 2019-10-04 DIAGNOSIS — H9313 Tinnitus, bilateral: Secondary | ICD-10-CM

## 2019-10-04 DIAGNOSIS — M7522 Bicipital tendinitis, left shoulder: Secondary | ICD-10-CM | POA: Insufficient documentation

## 2019-10-04 DIAGNOSIS — M778 Other enthesopathies, not elsewhere classified: Secondary | ICD-10-CM | POA: Insufficient documentation

## 2019-10-04 DIAGNOSIS — M19049 Primary osteoarthritis, unspecified hand: Secondary | ICD-10-CM

## 2019-10-04 DIAGNOSIS — H9190 Unspecified hearing loss, unspecified ear: Secondary | ICD-10-CM

## 2019-10-04 NOTE — Patient Instructions (Signed)
Recommendations:  Right finger arthritis  Use ice pack/bag of frozen peas 20 minutes on 20 minutes off for pain and swelling  REST the hand within reason for several days with flare ups  You can use over the counter Ibuprofen 200mg , 3 tablets twice daily for up to 5 days for acute flare up  You could use as an alternative ,topical creams over the counter such as Voltaren or Capsaicin cream  Elbow and biceps tendonitis left arm   Consider arm sling for an hour or 2 at a time to rest  Cut back on activity for 5- 7 days that would aggravate the arm  Use ice water therapy 20 minutes on , 20 minutes off   You can use over the counter Ibuprofen 200mg , 3 tablets twice daily for up to 5 days for acute flare up   We will make referral to ENT for ringing in ears and hearing loss
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
## 2019-10-09 ENCOUNTER — Telehealth: Payer: Self-pay | Admitting: Medical

## 2019-10-09 NOTE — Telephone Encounter (Signed)
Pt called and would like to go to another ENT Dr Benjamine Mola does not take his secondary insurance [employer] pt needs to go some where else that takes that pt can be reached at [phone]

## 2019-10-09 NOTE — Telephone Encounter (Signed)
Sent to ENT associate [facility]

## 2019-10-09 NOTE — Telephone Encounter (Signed)
See message, need to try different provider who accepts his insurance

## 2019-10-18 ENCOUNTER — Other Ambulatory Visit: Payer: Self-pay

## 2019-10-18 ENCOUNTER — Ambulatory Visit (INDEPENDENT_AMBULATORY_CARE_PROVIDER_SITE_OTHER): Payer: BC Managed Care – PPO | Admitting: Medical

## 2019-10-18 ENCOUNTER — Encounter: Payer: Self-pay | Admitting: Medical

## 2019-10-18 VITALS — BP 116/78 | HR 64 | Ht 73.0 in | Wt 234.0 lb

## 2019-10-18 DIAGNOSIS — M7712 Lateral epicondylitis, left elbow: Secondary | ICD-10-CM | POA: Diagnosis not present

## 2019-10-18 DIAGNOSIS — M7522 Bicipital tendinitis, left shoulder: Secondary | ICD-10-CM

## 2019-10-18 DIAGNOSIS — G546 Phantom limb syndrome with pain: Secondary | ICD-10-CM | POA: Insufficient documentation

## 2019-10-18 DIAGNOSIS — M778 Other enthesopathies, not elsewhere classified: Secondary | ICD-10-CM | POA: Diagnosis not present

## 2019-10-18 DIAGNOSIS — T84062A Wear of articular bearing surface of internal prosthetic right knee joint, initial encounter: Secondary | ICD-10-CM | POA: Insufficient documentation

## 2019-10-18 DIAGNOSIS — T84062D Wear of articular bearing surface of internal prosthetic right knee joint, subsequent encounter: Secondary | ICD-10-CM

## 2019-10-18 DIAGNOSIS — Z89511 Acquired absence of right leg below knee: Secondary | ICD-10-CM

## 2019-10-18 DIAGNOSIS — Z449 Encounter for fitting and adjustment of unspecified external prosthetic device: Secondary | ICD-10-CM | POA: Insufficient documentation
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
## 2019-10-18 NOTE — Progress Notes (Signed)
Done

## 2019-11-15 DIAGNOSIS — M25522 Pain in left elbow: Secondary | ICD-10-CM | POA: Diagnosis not present

## 2019-11-15 DIAGNOSIS — M25512 Pain in left shoulder: Secondary | ICD-10-CM | POA: Diagnosis not present

## 2019-11-16 ENCOUNTER — Other Ambulatory Visit: Payer: Self-pay

## 2019-11-16 ENCOUNTER — Ambulatory Visit (INDEPENDENT_AMBULATORY_CARE_PROVIDER_SITE_OTHER): Payer: BC Managed Care – PPO | Admitting: Cardiology

## 2019-11-16 ENCOUNTER — Encounter: Payer: Self-pay | Admitting: Cardiology

## 2019-11-16 VITALS — BP 110/68 | HR 61 | Ht 72.0 in | Wt 233.0 lb

## 2019-11-16 DIAGNOSIS — I480 Paroxysmal atrial fibrillation: Secondary | ICD-10-CM

## 2019-11-16 NOTE — Patient Instructions (Signed)
Medication Instructions:  Your physician recommends that you continue on your current medications as directed. Please refer to the Current Medication list given to you today.  *If you need a refill on your cardiac medications before your next appointment, please call your pharmacy*   Lab Work: None ordered If you have labs (blood work) drawn today and your tests are completely normal, you will receive your results only by: . MyChart Message (if you have MyChart) OR . A paper copy in the mail If you have any lab test that is abnormal or we need to change your treatment, we will call you to review the results.   Testing/Procedures: None ordered   Follow-Up: At [facility], you and your health needs are our priority.  As part of our continuing mission to provide you with exceptional heart care, we have created designated Provider Care Teams.  These Care Teams include your primary Cardiologist (physician) and Advanced Practice Providers (APPs -  Physician Assistants and Nurse Practitioners) who all work together to provide you with the care you need, when you need it.  We recommend signing up for the patient portal called "MyChart".  Sign up information is provided on this After Visit Summary.  MyChart is used to connect with patients for Virtual Visits (Telemedicine).  Patients are able to view lab/test results, encounter notes, upcoming appointments, etc.  Non-urgent messages can be sent to your provider as well.   To learn more about what you can do with MyChart, go to [email or web address].    Your next appointment:   6 month(s)  The format for your next appointment:   In Person  Provider:   Will Camnitz, MD   Thank you for choosing [facility]!!   Luciann Gossett, RN [phone]    Other Instructions    
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
## 2019-11-22 ENCOUNTER — Other Ambulatory Visit: Payer: Self-pay | Admitting: Orthopaedic Surgery

## 2019-11-22 DIAGNOSIS — M25512 Pain in left shoulder: Secondary | ICD-10-CM

## 2019-11-28 DIAGNOSIS — J343 Hypertrophy of nasal turbinates: Secondary | ICD-10-CM | POA: Diagnosis not present

## 2019-11-28 DIAGNOSIS — H9312 Tinnitus, left ear: Secondary | ICD-10-CM | POA: Diagnosis not present

## 2019-11-28 DIAGNOSIS — H903 Sensorineural hearing loss, bilateral: Secondary | ICD-10-CM | POA: Diagnosis not present

## 2019-12-10 ENCOUNTER — Other Ambulatory Visit: Payer: Self-pay

## 2019-12-10 ENCOUNTER — Ambulatory Visit
Admission: RE | Admit: 2019-12-10 | Discharge: 2019-12-10 | Disposition: A | Discharge status: Home / Self Care | Payer: BC Managed Care – PPO | Source: Ambulatory Visit | Attending: Orthopaedic Surgery | Admitting: Orthopaedic Surgery

## 2019-12-10 DIAGNOSIS — M19012 Primary osteoarthritis, left shoulder: Secondary | ICD-10-CM | POA: Diagnosis not present

## 2019-12-10 DIAGNOSIS — M6258 Muscle wasting and atrophy, not elsewhere classified, other site: Secondary | ICD-10-CM | POA: Diagnosis not present

## 2019-12-10 DIAGNOSIS — R531 Weakness: Secondary | ICD-10-CM | POA: Diagnosis not present

## 2019-12-10 DIAGNOSIS — M25512 Pain in left shoulder: Secondary | ICD-10-CM

## 2019-12-10 DIAGNOSIS — M75102 Unspecified rotator cuff tear or rupture of left shoulder, not specified as traumatic: Secondary | ICD-10-CM | POA: Diagnosis not present

## 2019-12-15 DIAGNOSIS — Z89511 Acquired absence of right leg below knee: Secondary | ICD-10-CM | POA: Diagnosis not present

## 2019-12-27 DIAGNOSIS — M75122 Complete rotator cuff tear or rupture of left shoulder, not specified as traumatic: Secondary | ICD-10-CM | POA: Diagnosis not present

## 2019-12-27 DIAGNOSIS — M25512 Pain in left shoulder: Secondary | ICD-10-CM | POA: Diagnosis not present

## 2019-12-27 DIAGNOSIS — M25522 Pain in left elbow: Secondary | ICD-10-CM | POA: Diagnosis not present

## 2020-01-15 DIAGNOSIS — M7542 Impingement syndrome of left shoulder: Secondary | ICD-10-CM | POA: Diagnosis not present

## 2020-01-15 DIAGNOSIS — G8918 Other acute postprocedural pain: Secondary | ICD-10-CM | POA: Diagnosis not present

## 2020-01-15 DIAGNOSIS — M75120 Complete rotator cuff tear or rupture of unspecified shoulder, not specified as traumatic: Secondary | ICD-10-CM | POA: Diagnosis not present

## 2020-01-15 DIAGNOSIS — M7522 Bicipital tendinitis, left shoulder: Secondary | ICD-10-CM | POA: Diagnosis not present

## 2020-01-15 DIAGNOSIS — M659 Synovitis and tenosynovitis, unspecified: Secondary | ICD-10-CM | POA: Diagnosis not present

## 2020-01-15 DIAGNOSIS — M7552 Bursitis of left shoulder: Secondary | ICD-10-CM | POA: Diagnosis not present

## 2020-01-15 DIAGNOSIS — M75122 Complete rotator cuff tear or rupture of left shoulder, not specified as traumatic: Secondary | ICD-10-CM | POA: Diagnosis not present

## 2020-01-15 DIAGNOSIS — M19012 Primary osteoarthritis, left shoulder: Secondary | ICD-10-CM | POA: Diagnosis not present

## 2020-01-30 DIAGNOSIS — M75122 Complete rotator cuff tear or rupture of left shoulder, not specified as traumatic: Secondary | ICD-10-CM | POA: Diagnosis not present

## 2020-01-30 DIAGNOSIS — M25512 Pain in left shoulder: Secondary | ICD-10-CM | POA: Diagnosis not present

## 2020-02-07 DIAGNOSIS — M25512 Pain in left shoulder: Secondary | ICD-10-CM | POA: Diagnosis not present

## 2020-02-14 DIAGNOSIS — M25512 Pain in left shoulder: Secondary | ICD-10-CM | POA: Diagnosis not present

## 2020-02-19 DIAGNOSIS — M25512 Pain in left shoulder: Secondary | ICD-10-CM | POA: Diagnosis not present

## 2020-02-22 DIAGNOSIS — M25512 Pain in left shoulder: Secondary | ICD-10-CM | POA: Diagnosis not present

## 2020-02-27 DIAGNOSIS — M25512 Pain in left shoulder: Secondary | ICD-10-CM | POA: Diagnosis not present

## 2020-02-29 DIAGNOSIS — M25512 Pain in left shoulder: Secondary | ICD-10-CM | POA: Diagnosis not present

## 2020-03-04 ENCOUNTER — Other Ambulatory Visit: Payer: Self-pay | Admitting: Cardiology

## 2020-03-04 DIAGNOSIS — M25512 Pain in left shoulder: Secondary | ICD-10-CM | POA: Diagnosis not present

## 2020-03-07 DIAGNOSIS — M25512 Pain in left shoulder: Secondary | ICD-10-CM | POA: Diagnosis not present

## 2020-03-12 DIAGNOSIS — M25512 Pain in left shoulder: Secondary | ICD-10-CM | POA: Diagnosis not present

## 2020-03-15 DIAGNOSIS — M25512 Pain in left shoulder: Secondary | ICD-10-CM | POA: Diagnosis not present

## 2020-03-19 DIAGNOSIS — M25512 Pain in left shoulder: Secondary | ICD-10-CM | POA: Diagnosis not present

## 2020-03-22 DIAGNOSIS — M25512 Pain in left shoulder: Secondary | ICD-10-CM | POA: Diagnosis not present

## 2020-03-26 DIAGNOSIS — M25512 Pain in left shoulder: Secondary | ICD-10-CM | POA: Diagnosis not present

## 2020-03-29 DIAGNOSIS — M25512 Pain in left shoulder: Secondary | ICD-10-CM | POA: Diagnosis not present

## 2020-04-02 DIAGNOSIS — M25512 Pain in left shoulder: Secondary | ICD-10-CM | POA: Diagnosis not present

## 2020-04-05 DIAGNOSIS — M25512 Pain in left shoulder: Secondary | ICD-10-CM | POA: Diagnosis not present

## 2020-04-08 DIAGNOSIS — M25512 Pain in left shoulder: Secondary | ICD-10-CM | POA: Diagnosis not present

## 2020-04-17 DIAGNOSIS — M25512 Pain in left shoulder: Secondary | ICD-10-CM | POA: Diagnosis not present

## 2020-04-23 DIAGNOSIS — M25512 Pain in left shoulder: Secondary | ICD-10-CM | POA: Diagnosis not present

## 2020-04-26 DIAGNOSIS — M25512 Pain in left shoulder: Secondary | ICD-10-CM | POA: Diagnosis not present

## 2020-05-02 ENCOUNTER — Other Ambulatory Visit: Payer: Self-pay

## 2020-05-02 ENCOUNTER — Encounter: Payer: Self-pay | Admitting: Cardiology

## 2020-05-02 ENCOUNTER — Ambulatory Visit (INDEPENDENT_AMBULATORY_CARE_PROVIDER_SITE_OTHER): Payer: BC Managed Care – PPO | Admitting: Cardiology

## 2020-05-02 VITALS — BP 130/82 | HR 69 | Ht 73.0 in | Wt 238.0 lb

## 2020-05-02 DIAGNOSIS — I480 Paroxysmal atrial fibrillation: Secondary | ICD-10-CM | POA: Diagnosis not present

## 2020-05-02 NOTE — Patient Instructions (Signed)
Medication Instructions:  °Your physician recommends that you continue on your current medications as directed. Please refer to the Current Medication list given to you today. ° °*If you need a refill on your cardiac medications before your next appointment, please call your pharmacy* ° ° °Lab Work: °None ordered ° ° °Testing/Procedures: °None ordered ° ° °Follow-Up: °At [facility], you and your health needs are our priority.  As part of our continuing mission to provide you with exceptional heart care, we have created designated Provider Care Teams.  These Care Teams include your primary Cardiologist (physician) and Advanced Practice Providers (APPs -  Physician Assistants and Nurse Practitioners) who all work together to provide you with the care you need, when you need it. ° °Your next appointment:   °6 month(s) ° °The format for your next appointment:   °In Person ° °Provider:   °Will Camnitz, MD ° ° ° °Thank you for choosing [facility]!! ° ° °Ranyah Groeneveld, RN °[phone] °  °
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
## 2020-05-03 DIAGNOSIS — M25512 Pain in left shoulder: Secondary | ICD-10-CM | POA: Diagnosis not present

## 2020-05-07 DIAGNOSIS — M25512 Pain in left shoulder: Secondary | ICD-10-CM | POA: Diagnosis not present

## 2020-05-09 DIAGNOSIS — M25512 Pain in left shoulder: Secondary | ICD-10-CM | POA: Diagnosis not present

## 2020-05-14 DIAGNOSIS — M25512 Pain in left shoulder: Secondary | ICD-10-CM | POA: Diagnosis not present

## 2020-05-16 DIAGNOSIS — M25512 Pain in left shoulder: Secondary | ICD-10-CM | POA: Diagnosis not present

## 2020-05-22 DIAGNOSIS — M25512 Pain in left shoulder: Secondary | ICD-10-CM | POA: Diagnosis not present

## 2020-05-24 DIAGNOSIS — M25512 Pain in left shoulder: Secondary | ICD-10-CM | POA: Diagnosis not present

## 2020-05-28 DIAGNOSIS — M25512 Pain in left shoulder: Secondary | ICD-10-CM | POA: Diagnosis not present

## 2020-05-30 DIAGNOSIS — M25512 Pain in left shoulder: Secondary | ICD-10-CM | POA: Diagnosis not present

## 2020-06-04 DIAGNOSIS — M25512 Pain in left shoulder: Secondary | ICD-10-CM | POA: Diagnosis not present

## 2020-06-07 DIAGNOSIS — M25522 Pain in left elbow: Secondary | ICD-10-CM | POA: Diagnosis not present

## 2020-06-11 DIAGNOSIS — M25522 Pain in left elbow: Secondary | ICD-10-CM | POA: Diagnosis not present

## 2020-06-14 DIAGNOSIS — M25512 Pain in left shoulder: Secondary | ICD-10-CM | POA: Diagnosis not present

## 2020-06-17 DIAGNOSIS — M25512 Pain in left shoulder: Secondary | ICD-10-CM | POA: Diagnosis not present

## 2020-06-21 DIAGNOSIS — M25512 Pain in left shoulder: Secondary | ICD-10-CM | POA: Diagnosis not present

## 2020-06-25 DIAGNOSIS — M25512 Pain in left shoulder: Secondary | ICD-10-CM | POA: Diagnosis not present

## 2020-06-26 DIAGNOSIS — M75122 Complete rotator cuff tear or rupture of left shoulder, not specified as traumatic: Secondary | ICD-10-CM | POA: Diagnosis not present

## 2020-06-26 DIAGNOSIS — M25512 Pain in left shoulder: Secondary | ICD-10-CM | POA: Diagnosis not present

## 2020-06-26 DIAGNOSIS — M25511 Pain in right shoulder: Secondary | ICD-10-CM | POA: Diagnosis not present

## 2020-06-27 DIAGNOSIS — M25512 Pain in left shoulder: Secondary | ICD-10-CM | POA: Diagnosis not present

## 2020-07-02 DIAGNOSIS — M25512 Pain in left shoulder: Secondary | ICD-10-CM | POA: Diagnosis not present

## 2020-07-03 ENCOUNTER — Other Ambulatory Visit (HOSPITAL_COMMUNITY)
Admission: RE | Admit: 2020-07-03 | Discharge: 2020-07-03 | Disposition: A | Discharge status: Home / Self Care | Payer: BC Managed Care – PPO | Source: Ambulatory Visit | Attending: Medical | Admitting: Medical

## 2020-07-03 ENCOUNTER — Other Ambulatory Visit: Payer: Self-pay

## 2020-07-03 ENCOUNTER — Encounter: Payer: Self-pay | Admitting: Medical

## 2020-07-03 ENCOUNTER — Ambulatory Visit (INDEPENDENT_AMBULATORY_CARE_PROVIDER_SITE_OTHER): Payer: BC Managed Care – PPO | Admitting: Medical

## 2020-07-03 VITALS — BP 130/76 | HR 62 | Ht 73.0 in | Wt 233.8 lb

## 2020-07-03 DIAGNOSIS — L989 Disorder of the skin and subcutaneous tissue, unspecified: Secondary | ICD-10-CM

## 2020-07-03 DIAGNOSIS — D3617 Benign neoplasm of peripheral nerves and autonomic nervous system of trunk, unspecified: Secondary | ICD-10-CM | POA: Diagnosis not present

## 2020-07-03 DIAGNOSIS — R897 Abnormal histological findings in specimens from other organs, systems and tissues: Secondary | ICD-10-CM | POA: Diagnosis not present
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
## 2020-07-03 NOTE — Patient Instructions (Signed)
Return in 7-10 days for suture removal  Pathology takes about 4-7 days to result  Keep the area clean and dry until you return.  If needed, tape clear plastic wrap around the wound to keep it dry, or use sponge bath for hygeine.   Just keep the wound dry  If any signs of redness, pus, warmth or pain, return immediately   Pyogenic Granuloma Pyogenic granuloma is a growth (lesion) that forms on the skin or on the mucous membranes of the mouth. This type of growth is a lump of very red tissue that bleeds easily. A pyogenic granuloma is usually a single lesion that most often affects:  The head and neck.  The mucous membranes of the mouth or tongue.  The upper body.  The hands and feet. A pyogenic granuloma usually measures about 0.5 inch (1.3 cm), but lesions can be smaller or larger. This condition does not spread from person to person (is not contagious). The lesion is benign, which means it is not cancerous. What are the causes? This condition is caused by a reaction of your skin or mucous membranes. The reaction causes a mound of tiny blood vessels (capillaries) to form a lesion. A pyogenic granuloma often happens after a minor injury, such as pricking your skin or biting your lip or tongue. Sometimes it occurs without an injury. The exact cause of the reaction is not known. What increases the risk? You are more likely to develop this condition if:  You are pregnant.  You are a child or young adult.  You take certain medicines. These especially include: ? Medicines for acne. ? Birth control pills. ? Some medicines used to treat cancer, HIV, or AIDS. What are the signs or symptoms? The main symptom of this condition is a raised or lumpy lesion that is very red. Your lesion may also:  Have a crusty, broken, and irritated (ulcerated) surface.  Bleed easily.  Be slightly sore. How is this diagnosed? This condition is diagnosed based on your symptoms and medical history,  especially if you recently had an injury.  You may also have:  A physical exam.  A small piece of your granuloma removed for testing (biopsy) to rule out cancer. How is this treated? A small lesion may go away without treatment. You may have to stop or change any medicines that caused your lesion. Pyogenic granulomas caused by pregnancy usually go away after delivery. This condition may also be treated by removal of your lesion. This may be done if your lesion is large, irritated, or bleeds easily. Removal may involve:  Curettage. This scrapes away the lesion.  Using chemicals or electric energy to destroy the lesion.  Surgical excision. This removes the lesion along with a small piece of normal skin or mucous membrane. This is the best treatment to prevent the lesion from coming back. Follow these instructions at home:  Take over-the-counter and prescription medicines only as told by your health care provider.  Do not scratch or pick at your lesion. Cover your lesion area with a bandage (dressing) or gauze to avoid having anything rub against your lesion.  Keep your lesion clean to avoid infection.  Keep all follow-up visits as told by your health care provider. This is important. Contact a health care provider if:  You have a fever.  Your lesion bleeds.  Your lesion comes back after treatment. Summary  Pyogenic granuloma is a growth (lesion) that forms on the skin or on the mucous membranes of  the mouth. This condition does not spread from person to person (is not contagious). The lesion is benign, which means it is not cancerous.  A small lesion may go away without treatment. If your lesion is large, irritated, or bleeds easily, you may need to have it removed.  Do not scratch or pick at your lesion. Cover your lesion area with a bandage (dressing) or gauze to avoid having anything rub against your lesion.  Keep your lesion clean to avoid infection. This information is not  intended to replace advice given to you by your health care provider. Make sure you discuss any questions you have with your health care provider. Document Revised: 03/09/2019 Document Reviewed: 03/09/2019 Elsevier Patient Education  2021 [employer].

## 2020-07-04 DIAGNOSIS — M25512 Pain in left shoulder: Secondary | ICD-10-CM | POA: Diagnosis not present

## 2020-07-05 LAB — SURGICAL PATHOLOGY

## 2020-07-09 ENCOUNTER — Other Ambulatory Visit: Payer: Self-pay

## 2020-07-09 DIAGNOSIS — L989 Disorder of the skin and subcutaneous tissue, unspecified: Secondary | ICD-10-CM

## 2020-07-09 DIAGNOSIS — M25512 Pain in left shoulder: Secondary | ICD-10-CM | POA: Diagnosis not present

## 2020-07-10 ENCOUNTER — Encounter: Payer: Self-pay | Admitting: Medical

## 2020-07-10 ENCOUNTER — Ambulatory Visit: Payer: BC Managed Care – PPO | Admitting: Medical

## 2020-07-10 ENCOUNTER — Telehealth: Payer: Self-pay | Admitting: Medical

## 2020-07-10 ENCOUNTER — Other Ambulatory Visit: Payer: Self-pay

## 2020-07-10 VITALS — BP 128/88 | HR 59 | Ht 73.0 in | Wt 237.8 lb

## 2020-07-10 DIAGNOSIS — L989 Disorder of the skin and subcutaneous tissue, unspecified: Secondary | ICD-10-CM

## 2020-07-10 DIAGNOSIS — D3610 Benign neoplasm of peripheral nerves and autonomic nervous system, unspecified: Secondary | ICD-10-CM

## 2020-07-10 NOTE — Progress Notes (Signed)
Subjective:  Jon [name] is a 55 y.o. male who presents for Chief Complaint  Patient presents with  . Suture / Staple Removal     Here for suture removal.  I saw him February 16 for lesion on the right upper chest that had been growing in the last several months and gotten bigger and bigger.  No concerns with wound.  No fever no pain. No other aggravating or relieving factors.    No other c/o.  The following portions of the patient's history were reviewed and updated as appropriate: allergies, current medications, past family history, past medical history, past social history, past surgical history and problem list.  ROS Otherwise as in subjective above  Objective: BP 128/88   Pulse (!) 59   Ht 6\' 1"  (1.854 m)   Wt 237 lb 12.8 oz (107.9 kg)   SpO2 95%   BMI 31.37 kg/m   General appearance: alert, no distress, well developed, well nourished Right upper chest with linear biopsy wound and 2 interrupted nylon sutures present.  No surrounding erythema or pus or warmth or induration.  The lesion is approximately 1.5 cm from the eagle tattoo on his right chest superior to the nipple and somewhat medial compared to the nipple    Assessment: Encounter Diagnoses  Name Primary?  . Skin lesion Yes  . Neurofibroma      Plan: Cleaned and prepped his right upper chest.  Remove to nylon interrupted sutures.  No blood loss.  Patient tolerated procedure well.  We discussed wound care.  We discussed his pathology results that shows neurofibroma but has cell atypia at the base of the deep margin.  We will refer to dermatology for additional evaluation  Kaeo was seen today for suture / staple removal.  Diagnoses and all orders for this visit:  Skin lesion  Neurofibroma    Follow up: with dermatology

## 2020-07-10 NOTE — Telephone Encounter (Signed)
Regarding dermatology referral, please call the office we referred to.  He told me today he has an  appointment in April.  Please call them back and make sure whether he needs to be seen sooner or not.  I  did a punch biopsy but the pathology shows that there is cell atypia at the deep margin so he will need additional tissue removed which is why I am sending him to dermatology.

## 2020-07-11 DIAGNOSIS — M25512 Pain in left shoulder: Secondary | ICD-10-CM | POA: Diagnosis not present

## 2020-07-12 NOTE — Telephone Encounter (Signed)
Inquiring about whether sooner appointment is needed. Awaiting a response.

## 2020-07-15 ENCOUNTER — Telehealth: Payer: Self-pay | Admitting: Medical

## 2020-07-15 DIAGNOSIS — M25512 Pain in left shoulder: Secondary | ICD-10-CM | POA: Diagnosis not present

## 2020-07-15 NOTE — Telephone Encounter (Signed)
Jon [name] called and said pt actually needs to be referred to the skin surgery center instead of them

## 2020-07-15 NOTE — Telephone Encounter (Signed)
Please see previous message about referral

## 2020-07-15 NOTE — Telephone Encounter (Signed)
Ok, do this, need appt soon since we just did procedure and he needs larger area removed.

## 2020-07-16 ENCOUNTER — Other Ambulatory Visit: Payer: Self-pay

## 2020-07-16 DIAGNOSIS — L989 Disorder of the skin and subcutaneous tissue, unspecified: Secondary | ICD-10-CM

## 2020-07-16 NOTE — Telephone Encounter (Signed)
Referral has been sent to the skin surgery center.

## 2020-07-17 ENCOUNTER — Telehealth: Payer: Self-pay | Admitting: Medical

## 2020-07-17 NOTE — Telephone Encounter (Signed)
Done

## 2020-07-17 NOTE — Telephone Encounter (Signed)
Scan and fax back form to [facility] regarding prosthesis

## 2020-07-26 NOTE — Telephone Encounter (Signed)
See prior message.  I have a new message to add to this.  I received a note from his dentist about a sleep study done by the dentist to show sleep apnea.  We did not discuss this.  So if he does not pursue treatment with the dentist, then we will need to recheck on this and discuss other options  I am assuming he is going to try an oral mouthpiece with the dentist based on the way the report reads

## 2020-07-26 NOTE — Telephone Encounter (Signed)
This is still in my inbox.  Has this been handled?

## 2020-07-31 ENCOUNTER — Ambulatory Visit (INDEPENDENT_AMBULATORY_CARE_PROVIDER_SITE_OTHER): Payer: BC Managed Care – PPO | Admitting: Medical

## 2020-07-31 ENCOUNTER — Encounter: Payer: Self-pay | Admitting: Medical

## 2020-07-31 ENCOUNTER — Other Ambulatory Visit: Payer: Self-pay

## 2020-07-31 VITALS — BP 134/80 | HR 62 | Ht 73.0 in | Wt 233.4 lb

## 2020-07-31 DIAGNOSIS — G4733 Obstructive sleep apnea (adult) (pediatric): Secondary | ICD-10-CM | POA: Diagnosis not present

## 2020-07-31 DIAGNOSIS — M67921 Unspecified disorder of synovium and tendon, right upper arm: Secondary | ICD-10-CM | POA: Diagnosis not present

## 2020-07-31 DIAGNOSIS — M270 Developmental disorders of jaws: Secondary | ICD-10-CM | POA: Insufficient documentation

## 2020-07-31 DIAGNOSIS — G8929 Other chronic pain: Secondary | ICD-10-CM | POA: Insufficient documentation

## 2020-07-31 DIAGNOSIS — L989 Disorder of the skin and subcutaneous tissue, unspecified: Secondary | ICD-10-CM | POA: Diagnosis not present

## 2020-07-31 DIAGNOSIS — M25511 Pain in right shoulder: Secondary | ICD-10-CM | POA: Diagnosis not present

## 2020-07-31 NOTE — Progress Notes (Signed)
Subjective:  Jon [name] is a 55 y.o. male who presents for Chief Complaint  Patient presents with  . Shoulder Pain    Right shoulder pain x6 months worsening over the last month    Here for right shoulder pain.  Has had ongoing pains in right shoulder for a while, but last Wednesday a week ago was boxing punching bag, and at one point had some insignificant pain.  He immediately stopped.  Been popping ibuprofen all week.  Right handed.  Pain mostly in anterior shoulder.  No other right arm pain.  No swelling, no bruising.  No numbness or tingling.  Has hx/o left shoulder surgery prior, Dr. Jeannie Fend, [facility]  OSA - uses CPAP daily.  Had been tested years ago, and recently dentist tested him.  He was tired of using CPAP.  Is pursuing oral mouthpiece through dentist and possibly oral surgery for tori in bottom of mouth under tongue  He has appt in early April with skin surgery center for f/u on recent abnormal biopsy here.   No other aggravating or relieving factors.    No other c/o.  The following portions of the patient's history were reviewed and updated as appropriate: allergies, current medications, past family history, past medical history, past social history, past surgical history and problem list.  ROS Otherwise as in subjective above  Objective: BP 134/80   Pulse 62   Ht 6\' 1"  (1.854 m)   Wt 233 lb 6.4 oz (105.9 kg)   SpO2 96%   BMI 30.79 kg/m   General appearance: alert, no distress, well developed, well nourished Right arm Tender over right biceps origin, otherwise shoulder and right arm nontender.   Pain with apprehension, hawkins and neers test.  slightly decreased right external and internal ROM, and pain noted with ext and internal ROM.  Tender with empty can test.   otherwise unremarkable exam Arms neurovascularly intact Oral: no tonsillar enlargement but enlarged tori in lower mouth under tongue Neck: supple nontender, normal ROM, no mass, no thyromegaly  or LAD    Assessment: Encounter Diagnoses  Name Primary?  . Chronic right shoulder pain Yes  . Tendinopathy of right biceps tendon   . OSA (obstructive sleep apnea)   . Skin lesion   . Torus mandibularis      Plan: Chronic right shoulder pain, right biceps tendonitis - possibly tendonitis and possible rotator cuff problem.  continue ice water pack 20 min on/20 min off, arm sling when possible, c/t OTC NSAID, and referral back to Dr. Jeannie Fend at [facility]  OSA, torus - has f/u with dentist for oral mouthpiece for sleep apnea, and has follow up with  oral surgeon   Skin lesion - she has follow up with skin surgery center from recent abnormal biopsy   [name] was seen today for shoulder pain.  Diagnoses and all orders for this visit:  Chronic right shoulder pain  Tendinopathy of right biceps tendon  OSA (obstructive sleep apnea)  Skin lesion  Torus mandibularis    Follow up: pending call back

## 2020-07-31 NOTE — Progress Notes (Signed)
Referral sent 

## 2020-07-31 NOTE — Progress Notes (Signed)
Done

## 2020-08-06 ENCOUNTER — Encounter: Payer: Self-pay | Admitting: Medical

## 2020-08-07 ENCOUNTER — Other Ambulatory Visit: Payer: Self-pay | Admitting: Cardiology

## 2020-08-12 ENCOUNTER — Ambulatory Visit
Admission: RE | Admit: 2020-08-12 | Discharge: 2020-08-12 | Disposition: A | Discharge status: Home / Self Care | Payer: BC Managed Care – PPO | Source: Ambulatory Visit | Attending: Medical | Admitting: Medical

## 2020-08-12 ENCOUNTER — Ambulatory Visit (INDEPENDENT_AMBULATORY_CARE_PROVIDER_SITE_OTHER): Payer: BC Managed Care – PPO | Admitting: Medical

## 2020-08-12 ENCOUNTER — Encounter: Payer: Self-pay | Admitting: Medical

## 2020-08-12 ENCOUNTER — Other Ambulatory Visit: Payer: Self-pay

## 2020-08-12 VITALS — BP 120/80 | HR 62 | Ht 73.0 in | Wt 239.4 lb

## 2020-08-12 DIAGNOSIS — G8929 Other chronic pain: Secondary | ICD-10-CM | POA: Diagnosis not present

## 2020-08-12 DIAGNOSIS — M25511 Pain in right shoulder: Secondary | ICD-10-CM | POA: Diagnosis not present

## 2020-08-12 DIAGNOSIS — M19011 Primary osteoarthritis, right shoulder: Secondary | ICD-10-CM | POA: Diagnosis not present

## 2020-08-12 DIAGNOSIS — S4991XA Unspecified injury of right shoulder and upper arm, initial encounter: Secondary | ICD-10-CM | POA: Diagnosis not present

## 2020-08-12 DIAGNOSIS — Z89511 Acquired absence of right leg below knee: Secondary | ICD-10-CM | POA: Diagnosis not present

## 2020-08-12 DIAGNOSIS — J9811 Atelectasis: Secondary | ICD-10-CM | POA: Diagnosis not present

## 2020-08-12 MED ORDER — HYDROCODONE-ACETAMINOPHEN 5-325 MG PO TABS: 1.0000 | ORAL_TABLET | Freq: Four times a day (QID) | ORAL | 0 refills | Status: DC | PRN
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
## 2020-08-12 NOTE — Patient Instructions (Signed)
Please go to [facility] for your shoulder xray.   Their hours are 8am - 4:30 pm Monday - Friday.  Take your insurance card with you.  [facility] [phone]  [address], [place], [place] [address]  [address] [place], [place] [address]

## 2020-08-14 ENCOUNTER — Telehealth: Payer: Self-pay | Admitting: Medical

## 2020-08-14 DIAGNOSIS — M25511 Pain in right shoulder: Secondary | ICD-10-CM | POA: Diagnosis not present

## 2020-08-14 DIAGNOSIS — M25551 Pain in right hip: Secondary | ICD-10-CM | POA: Diagnosis not present

## 2020-08-14 NOTE — Telephone Encounter (Signed)
Call and see if Dr. Karen Chafe can get him in this week.   We recently referred for shoulder pain, chronic, but he had new injury to same shoulder this week, had xray yesterday, and can't lift his shoulder.

## 2020-08-14 NOTE — Telephone Encounter (Signed)
Patient has an appointment today at 2:15

## 2020-08-20 DIAGNOSIS — G4733 Obstructive sleep apnea (adult) (pediatric): Secondary | ICD-10-CM | POA: Diagnosis not present

## 2020-08-22 DIAGNOSIS — D485 Neoplasm of uncertain behavior of skin: Secondary | ICD-10-CM | POA: Diagnosis not present

## 2020-09-06 DIAGNOSIS — M25511 Pain in right shoulder: Secondary | ICD-10-CM | POA: Diagnosis not present

## 2020-09-11 DIAGNOSIS — D235 Other benign neoplasm of skin of trunk: Secondary | ICD-10-CM | POA: Diagnosis not present

## 2020-09-11 DIAGNOSIS — D485 Neoplasm of uncertain behavior of skin: Secondary | ICD-10-CM | POA: Diagnosis not present

## 2020-09-12 DIAGNOSIS — M25511 Pain in right shoulder: Secondary | ICD-10-CM | POA: Diagnosis not present

## 2020-09-27 DIAGNOSIS — M75121 Complete rotator cuff tear or rupture of right shoulder, not specified as traumatic: Secondary | ICD-10-CM | POA: Diagnosis not present

## 2020-09-27 DIAGNOSIS — M7541 Impingement syndrome of right shoulder: Secondary | ICD-10-CM | POA: Diagnosis not present

## 2020-10-16 DIAGNOSIS — G8918 Other acute postprocedural pain: Secondary | ICD-10-CM | POA: Diagnosis not present

## 2020-10-16 DIAGNOSIS — M7551 Bursitis of right shoulder: Secondary | ICD-10-CM | POA: Diagnosis not present

## 2020-10-16 DIAGNOSIS — M19011 Primary osteoarthritis, right shoulder: Secondary | ICD-10-CM | POA: Diagnosis not present

## 2020-10-16 DIAGNOSIS — S4381XA Sprain of other specified parts of right shoulder girdle, initial encounter: Secondary | ICD-10-CM | POA: Diagnosis not present

## 2020-10-16 DIAGNOSIS — M7541 Impingement syndrome of right shoulder: Secondary | ICD-10-CM | POA: Diagnosis not present

## 2020-10-16 DIAGNOSIS — M75121 Complete rotator cuff tear or rupture of right shoulder, not specified as traumatic: Secondary | ICD-10-CM | POA: Diagnosis not present

## 2020-10-16 DIAGNOSIS — M7521 Bicipital tendinitis, right shoulder: Secondary | ICD-10-CM | POA: Diagnosis not present

## 2020-10-16 DIAGNOSIS — M24111 Other articular cartilage disorders, right shoulder: Secondary | ICD-10-CM | POA: Diagnosis not present

## 2020-10-23 DIAGNOSIS — M25511 Pain in right shoulder: Secondary | ICD-10-CM | POA: Diagnosis not present

## 2020-10-29 DIAGNOSIS — M25511 Pain in right shoulder: Secondary | ICD-10-CM | POA: Diagnosis not present

## 2020-10-31 ENCOUNTER — Other Ambulatory Visit: Payer: Self-pay

## 2020-10-31 ENCOUNTER — Encounter: Payer: Self-pay | Admitting: Cardiology

## 2020-10-31 ENCOUNTER — Ambulatory Visit (INDEPENDENT_AMBULATORY_CARE_PROVIDER_SITE_OTHER): Payer: BC Managed Care – PPO | Admitting: Cardiology

## 2020-10-31 VITALS — BP 124/78 | HR 57 | Ht 74.0 in | Wt 231.6 lb

## 2020-10-31 DIAGNOSIS — M25511 Pain in right shoulder: Secondary | ICD-10-CM | POA: Diagnosis not present

## 2020-10-31 DIAGNOSIS — I480 Paroxysmal atrial fibrillation: Secondary | ICD-10-CM | POA: Diagnosis not present
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
## 2020-10-31 NOTE — Patient Instructions (Signed)
Medication Instructions:  Your physician has recommended you make the following change in your medication: STOP Flecainide  *If you need a refill on your cardiac medications before your next appointment, please call your pharmacy*   Lab Work: None ordered   Testing/Procedures: None ordered   Follow-Up: At [facility], you and your health needs are our priority.  As part of our continuing mission to provide you with exceptional heart care, we have created designated Provider Care Teams.  These Care Teams include your primary Cardiologist (physician) and Advanced Practice Providers (APPs -  Physician Assistants and Nurse Practitioners) who all work together to provide you with the care you need, when you need it.  Your next appointment:   6 month(s)  The format for your next appointment:   In Person  Provider:   Will Camnitz, MD    Thank you for choosing [facility]!!   Thurston Brendlinger, RN [phone]        

## 2020-11-04 DIAGNOSIS — M25511 Pain in right shoulder: Secondary | ICD-10-CM | POA: Diagnosis not present

## 2020-11-12 ENCOUNTER — Other Ambulatory Visit: Payer: Self-pay | Admitting: Cardiology

## 2020-11-12 DIAGNOSIS — M25511 Pain in right shoulder: Secondary | ICD-10-CM | POA: Diagnosis not present

## 2020-11-14 DIAGNOSIS — M25511 Pain in right shoulder: Secondary | ICD-10-CM | POA: Diagnosis not present

## 2020-11-19 DIAGNOSIS — M25511 Pain in right shoulder: Secondary | ICD-10-CM | POA: Diagnosis not present

## 2020-11-21 DIAGNOSIS — M25511 Pain in right shoulder: Secondary | ICD-10-CM | POA: Diagnosis not present

## 2020-11-25 DIAGNOSIS — M25511 Pain in right shoulder: Secondary | ICD-10-CM | POA: Diagnosis not present

## 2020-11-28 DIAGNOSIS — M25511 Pain in right shoulder: Secondary | ICD-10-CM | POA: Diagnosis not present

## 2020-12-04 DIAGNOSIS — M25511 Pain in right shoulder: Secondary | ICD-10-CM | POA: Diagnosis not present

## 2020-12-06 DIAGNOSIS — M25511 Pain in right shoulder: Secondary | ICD-10-CM | POA: Diagnosis not present

## 2020-12-09 DIAGNOSIS — M25511 Pain in right shoulder: Secondary | ICD-10-CM | POA: Diagnosis not present

## 2020-12-12 DIAGNOSIS — M25511 Pain in right shoulder: Secondary | ICD-10-CM | POA: Diagnosis not present

## 2020-12-17 DIAGNOSIS — M25511 Pain in right shoulder: Secondary | ICD-10-CM | POA: Diagnosis not present

## 2020-12-24 DIAGNOSIS — M25511 Pain in right shoulder: Secondary | ICD-10-CM | POA: Diagnosis not present

## 2020-12-27 DIAGNOSIS — M25511 Pain in right shoulder: Secondary | ICD-10-CM | POA: Diagnosis not present

## 2020-12-31 DIAGNOSIS — M25511 Pain in right shoulder: Secondary | ICD-10-CM | POA: Diagnosis not present

## 2021-01-02 DIAGNOSIS — M25511 Pain in right shoulder: Secondary | ICD-10-CM | POA: Diagnosis not present

## 2021-01-02 DIAGNOSIS — H40013 Open angle with borderline findings, low risk, bilateral: Secondary | ICD-10-CM | POA: Diagnosis not present

## 2021-01-06 DIAGNOSIS — M25511 Pain in right shoulder: Secondary | ICD-10-CM | POA: Diagnosis not present

## 2021-01-08 DIAGNOSIS — M25511 Pain in right shoulder: Secondary | ICD-10-CM | POA: Diagnosis not present

## 2021-01-13 DIAGNOSIS — M25511 Pain in right shoulder: Secondary | ICD-10-CM | POA: Diagnosis not present

## 2021-01-16 DIAGNOSIS — M25511 Pain in right shoulder: Secondary | ICD-10-CM | POA: Diagnosis not present

## 2021-01-21 DIAGNOSIS — M25511 Pain in right shoulder: Secondary | ICD-10-CM | POA: Diagnosis not present

## 2021-01-23 DIAGNOSIS — M25511 Pain in right shoulder: Secondary | ICD-10-CM | POA: Diagnosis not present

## 2021-01-28 DIAGNOSIS — F4320 Adjustment disorder, unspecified: Secondary | ICD-10-CM | POA: Diagnosis not present

## 2021-01-28 DIAGNOSIS — M25511 Pain in right shoulder: Secondary | ICD-10-CM | POA: Diagnosis not present

## 2021-01-30 DIAGNOSIS — M25511 Pain in right shoulder: Secondary | ICD-10-CM | POA: Diagnosis not present

## 2021-01-31 DIAGNOSIS — F4320 Adjustment disorder, unspecified: Secondary | ICD-10-CM | POA: Diagnosis not present

## 2021-02-04 DIAGNOSIS — F4320 Adjustment disorder, unspecified: Secondary | ICD-10-CM | POA: Diagnosis not present

## 2021-02-04 DIAGNOSIS — M25511 Pain in right shoulder: Secondary | ICD-10-CM | POA: Diagnosis not present

## 2021-02-06 DIAGNOSIS — M25511 Pain in right shoulder: Secondary | ICD-10-CM | POA: Diagnosis not present

## 2021-02-11 DIAGNOSIS — F4320 Adjustment disorder, unspecified: Secondary | ICD-10-CM | POA: Diagnosis not present

## 2021-02-11 DIAGNOSIS — M25511 Pain in right shoulder: Secondary | ICD-10-CM | POA: Diagnosis not present

## 2021-02-18 DIAGNOSIS — F4320 Adjustment disorder, unspecified: Secondary | ICD-10-CM | POA: Diagnosis not present

## 2021-02-25 ENCOUNTER — Encounter: Payer: Self-pay | Admitting: Internal Medicine

## 2021-02-25 DIAGNOSIS — F4320 Adjustment disorder, unspecified: Secondary | ICD-10-CM | POA: Diagnosis not present

## 2021-03-04 DIAGNOSIS — F4320 Adjustment disorder, unspecified: Secondary | ICD-10-CM | POA: Diagnosis not present

## 2021-03-11 DIAGNOSIS — F4320 Adjustment disorder, unspecified: Secondary | ICD-10-CM | POA: Diagnosis not present

## 2021-03-11 DIAGNOSIS — M25511 Pain in right shoulder: Secondary | ICD-10-CM | POA: Diagnosis not present

## 2021-03-18 DIAGNOSIS — F4320 Adjustment disorder, unspecified: Secondary | ICD-10-CM | POA: Diagnosis not present

## 2021-03-25 DIAGNOSIS — F4320 Adjustment disorder, unspecified: Secondary | ICD-10-CM | POA: Diagnosis not present

## 2021-03-25 MED ORDER — FLECAINIDE ACETATE 100 MG PO TABS: 100.0000 mg | ORAL_TABLET | Freq: Two times a day (BID) | ORAL | 3 refills | Status: DC

## 2021-03-25 NOTE — Telephone Encounter (Signed)
Confirmed pharmacy with patient and supply of 90 day with refills.

## 2021-04-01 DIAGNOSIS — F4320 Adjustment disorder, unspecified: Secondary | ICD-10-CM | POA: Diagnosis not present

## 2021-04-02 DIAGNOSIS — Z89511 Acquired absence of right leg below knee: Secondary | ICD-10-CM | POA: Diagnosis not present

## 2021-04-08 DIAGNOSIS — F4320 Adjustment disorder, unspecified: Secondary | ICD-10-CM | POA: Diagnosis not present

## 2021-04-22 DIAGNOSIS — F4320 Adjustment disorder, unspecified: Secondary | ICD-10-CM | POA: Diagnosis not present

## 2021-04-29 DIAGNOSIS — F4320 Adjustment disorder, unspecified: Secondary | ICD-10-CM | POA: Diagnosis not present

## 2021-05-06 DIAGNOSIS — F4320 Adjustment disorder, unspecified: Secondary | ICD-10-CM | POA: Diagnosis not present

## 2021-05-20 DIAGNOSIS — F4320 Adjustment disorder, unspecified: Secondary | ICD-10-CM | POA: Diagnosis not present

## 2021-05-27 DIAGNOSIS — F4320 Adjustment disorder, unspecified: Secondary | ICD-10-CM | POA: Diagnosis not present

## 2021-06-03 DIAGNOSIS — F4320 Adjustment disorder, unspecified: Secondary | ICD-10-CM | POA: Diagnosis not present

## 2021-06-10 DIAGNOSIS — F4320 Adjustment disorder, unspecified: Secondary | ICD-10-CM | POA: Diagnosis not present

## 2021-06-17 DIAGNOSIS — F4320 Adjustment disorder, unspecified: Secondary | ICD-10-CM | POA: Diagnosis not present

## 2021-06-24 DIAGNOSIS — F4320 Adjustment disorder, unspecified: Secondary | ICD-10-CM | POA: Diagnosis not present

## 2021-06-27 ENCOUNTER — Encounter: Payer: Self-pay | Admitting: Internal Medicine

## 2021-07-01 DIAGNOSIS — F4320 Adjustment disorder, unspecified: Secondary | ICD-10-CM | POA: Diagnosis not present

## 2021-07-01 IMAGING — DX DG SHOULDER 2+V*R*
3 series · 3 of 3 positions shown · non-contrast
Comparison: None.

CLINICAL DATA: Chronic pain with recent trauma

EXAM:
RIGHT SHOULDER - 2+ VIEW

[dg shoulder right (1 of 3)]
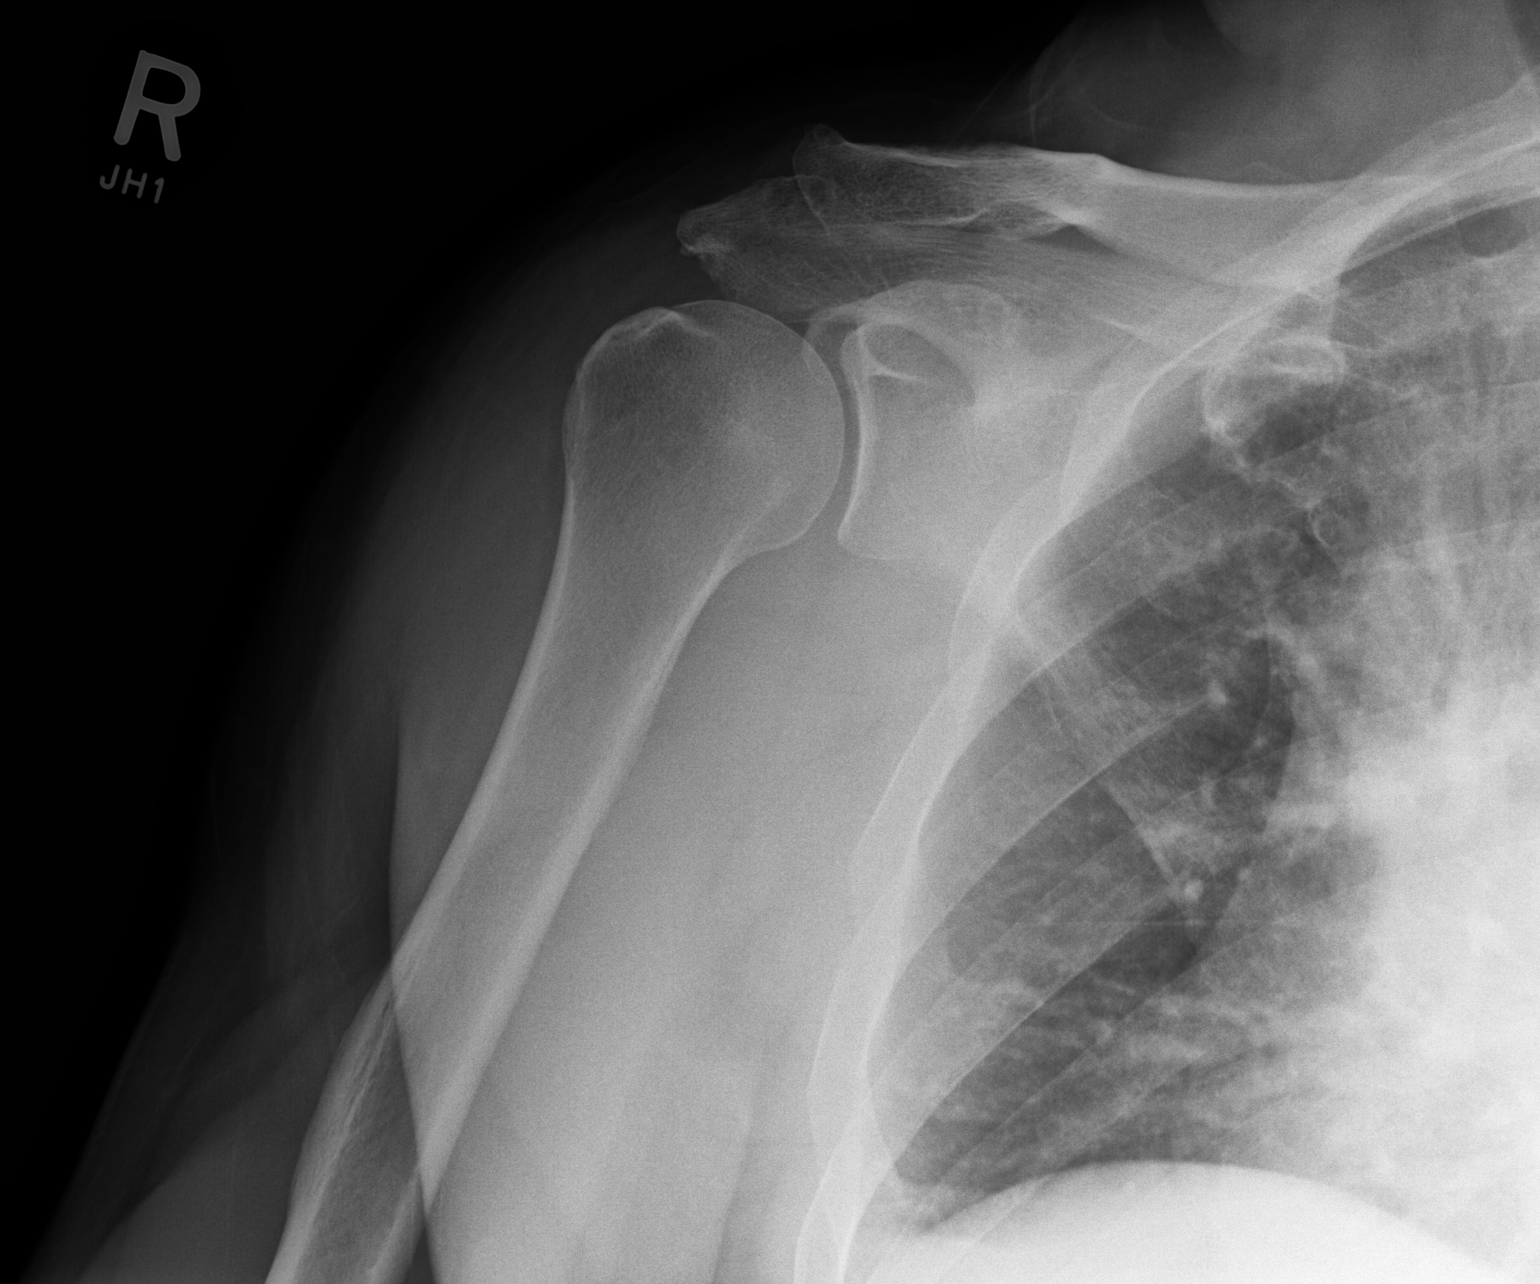

[dg shoulder right (2 of 3)]
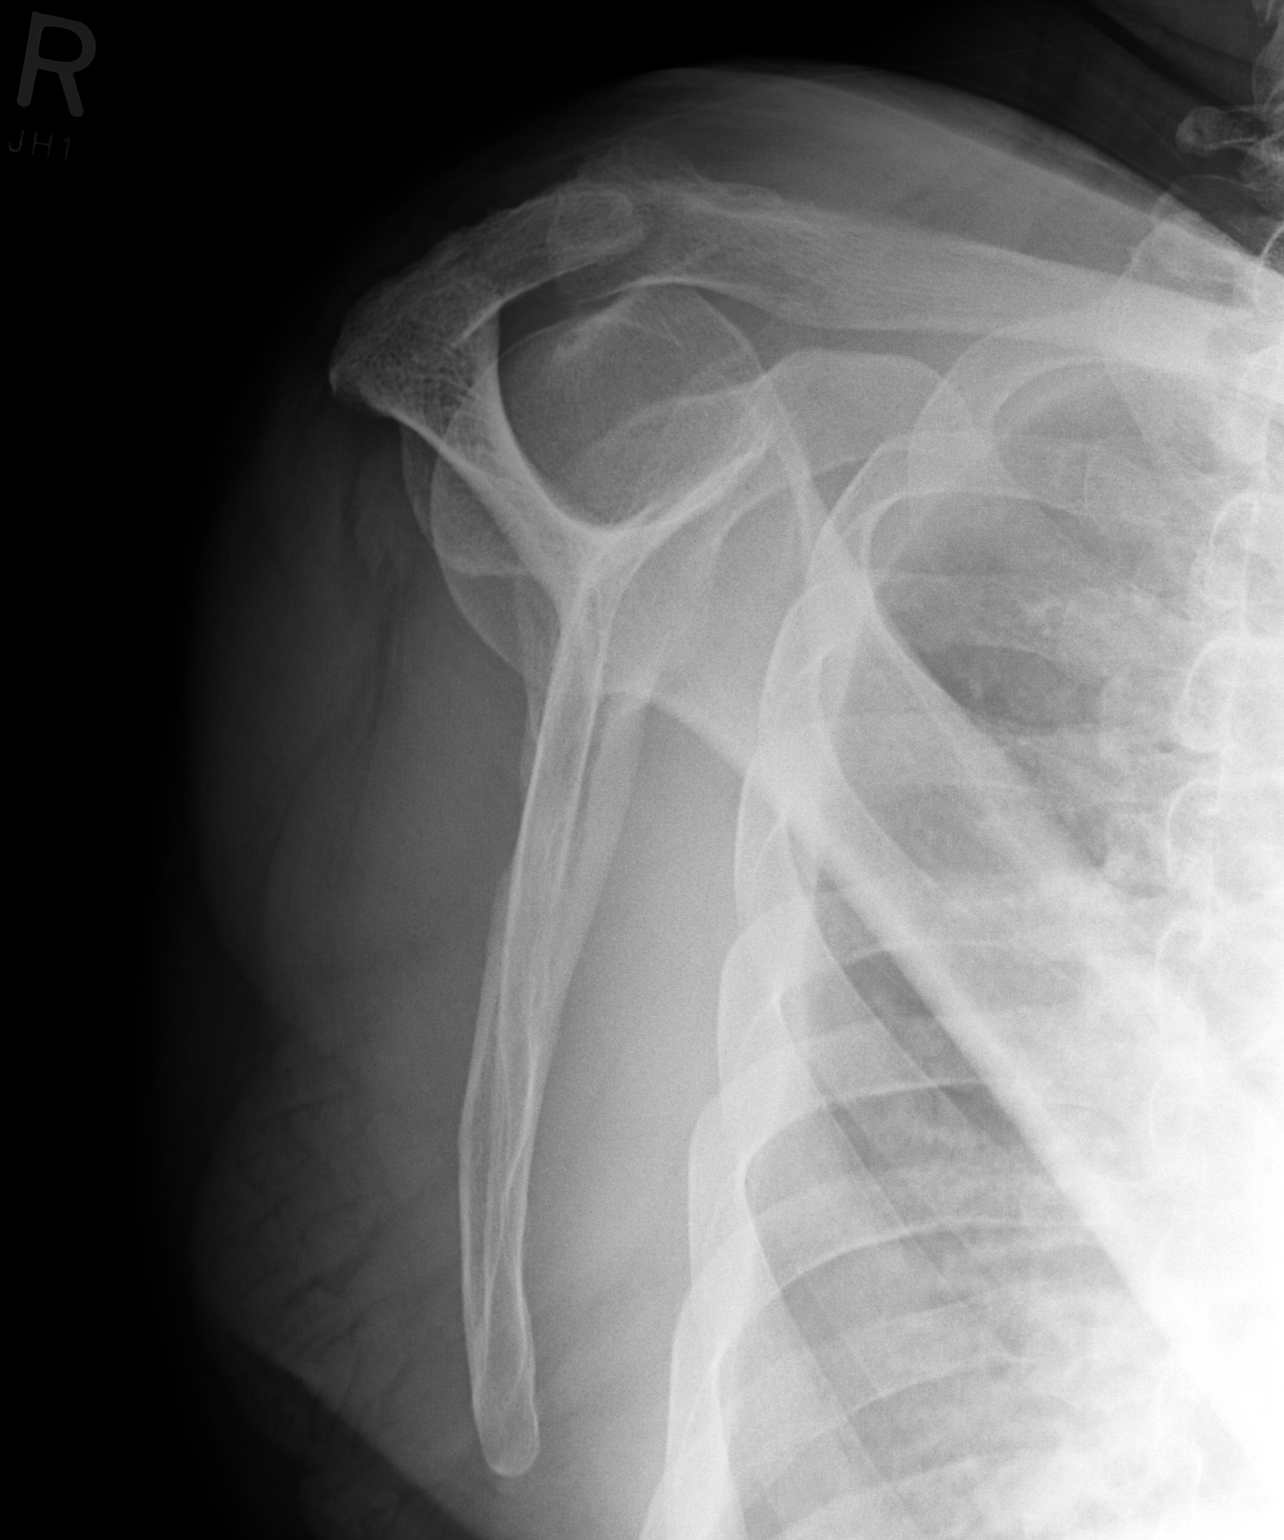

[dg shoulder right (3 of 3)]
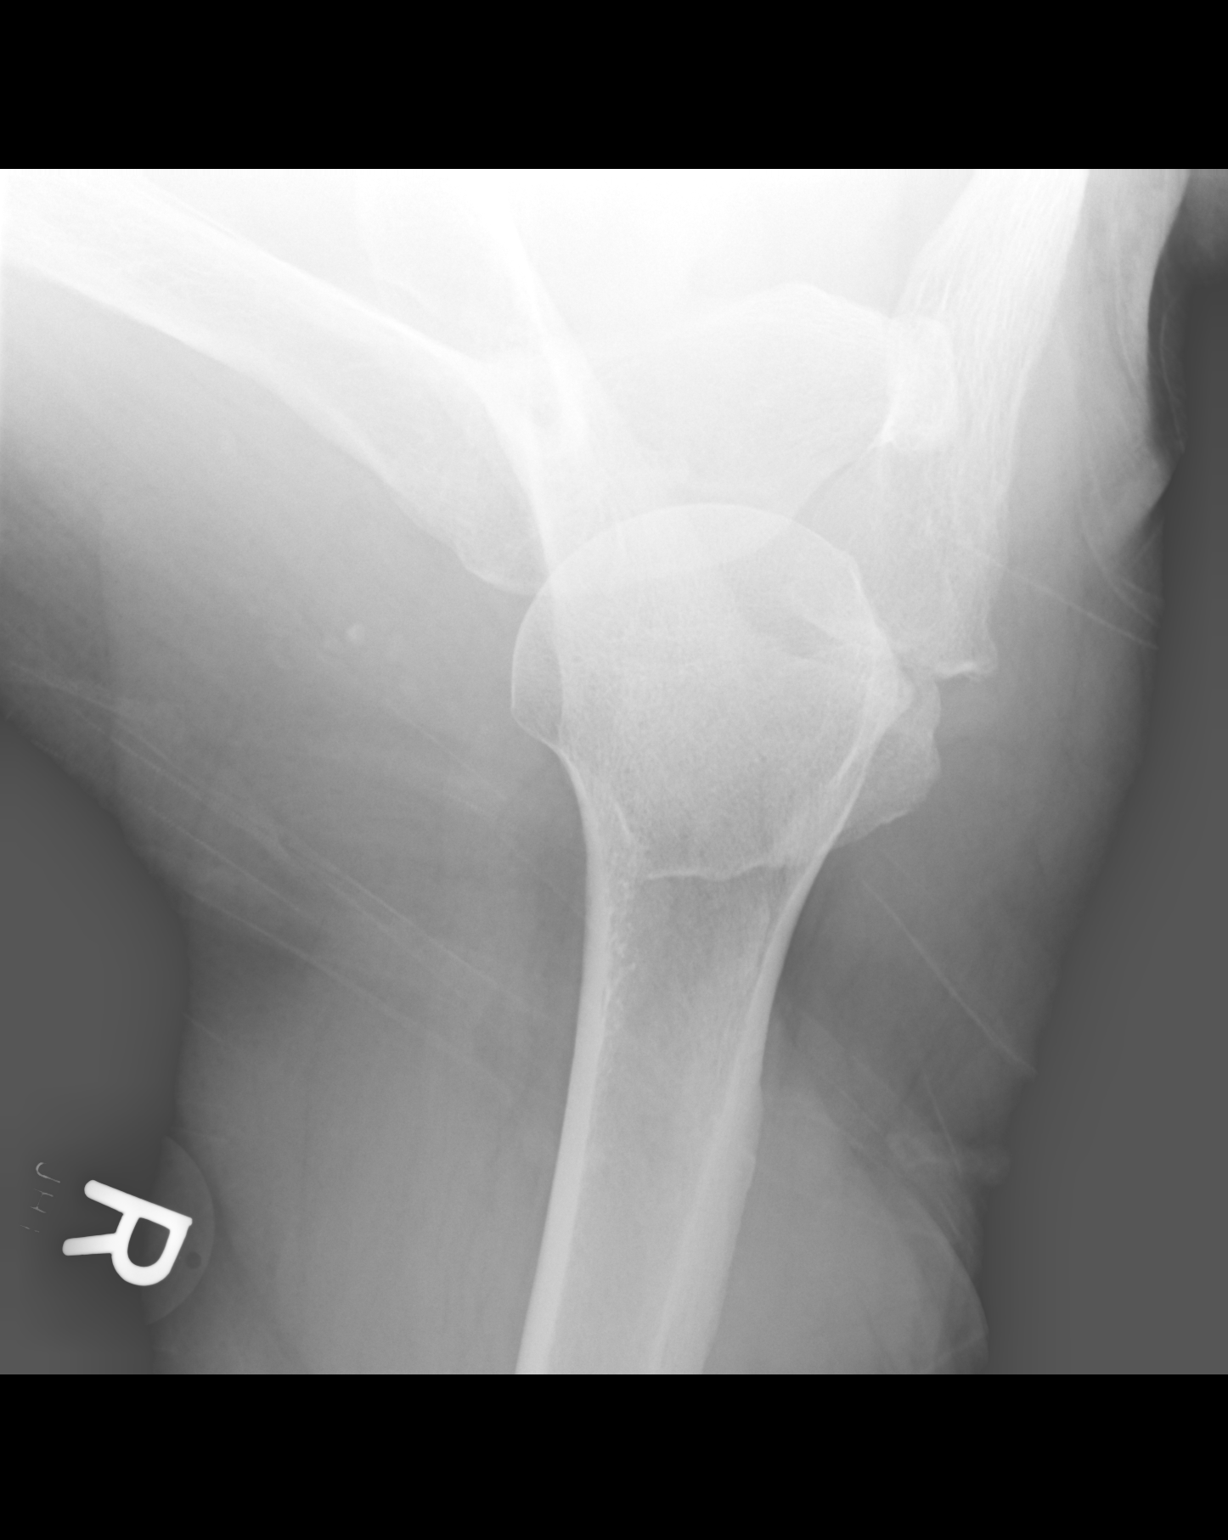

[3 of 3 positions shown; findings below may reference images not displayed]

FINDINGS: Oblique, Y scapular, and axillary images were obtained. No fracture
or dislocation. There is mild narrowing of the acromioclavicular
joint. Note that there is a degree of bony overgrowth along the
inferolateral right clavicle. Glenohumeral joint appears normal. No
erosive change. There is mild right base atelectasis.
IMPRESSION: Osteoarthritic change in the acromioclavicular joint. Note that bony
overgrowth along the inferolateral right clavicle potentially places
patient at increased risk for impingement syndrome. Glenohumeral
joint appears normal. No fracture or dislocation.

## 2021-07-15 DIAGNOSIS — F4320 Adjustment disorder, unspecified: Secondary | ICD-10-CM | POA: Diagnosis not present

## 2021-07-22 DIAGNOSIS — F4320 Adjustment disorder, unspecified: Secondary | ICD-10-CM | POA: Diagnosis not present

## 2021-07-29 DIAGNOSIS — F4320 Adjustment disorder, unspecified: Secondary | ICD-10-CM | POA: Diagnosis not present

## 2021-08-05 DIAGNOSIS — F4320 Adjustment disorder, unspecified: Secondary | ICD-10-CM | POA: Diagnosis not present

## 2021-08-12 DIAGNOSIS — F4320 Adjustment disorder, unspecified: Secondary | ICD-10-CM | POA: Diagnosis not present

## 2021-08-26 DIAGNOSIS — F4320 Adjustment disorder, unspecified: Secondary | ICD-10-CM | POA: Diagnosis not present

## 2021-09-04 ENCOUNTER — Ambulatory Visit (INDEPENDENT_AMBULATORY_CARE_PROVIDER_SITE_OTHER): Payer: BC Managed Care – PPO | Admitting: Medical

## 2021-09-04 VITALS — BP 130/86 | HR 72 | Wt 245.0 lb

## 2021-09-04 DIAGNOSIS — Z89511 Acquired absence of right leg below knee: Secondary | ICD-10-CM | POA: Diagnosis not present

## 2021-09-04 DIAGNOSIS — Z449 Encounter for fitting and adjustment of unspecified external prosthetic device: Secondary | ICD-10-CM

## 2021-09-04 DIAGNOSIS — T85618A Breakdown (mechanical) of other specified internal prosthetic devices, implants and grafts, initial encounter: Secondary | ICD-10-CM | POA: Diagnosis not present

## 2021-09-04 DIAGNOSIS — T84062D Wear of articular bearing surface of internal prosthetic right knee joint, subsequent encounter: Secondary | ICD-10-CM
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
## 2021-09-09 DIAGNOSIS — F4320 Adjustment disorder, unspecified: Secondary | ICD-10-CM | POA: Diagnosis not present

## 2021-09-11 ENCOUNTER — Encounter: Payer: Self-pay | Admitting: Medical

## 2021-09-11 ENCOUNTER — Ambulatory Visit (INDEPENDENT_AMBULATORY_CARE_PROVIDER_SITE_OTHER): Payer: BC Managed Care – PPO | Admitting: Medical

## 2021-09-11 ENCOUNTER — Ambulatory Visit
Admission: RE | Admit: 2021-09-11 | Discharge: 2021-09-11 | Disposition: A | Discharge status: Home / Self Care | Payer: BC Managed Care – PPO | Source: Ambulatory Visit | Attending: Medical | Admitting: Medical

## 2021-09-11 VITALS — BP 120/82 | HR 68 | Temp 98.5°F | Wt 240.8 lb

## 2021-09-11 DIAGNOSIS — M7552 Bursitis of left shoulder: Secondary | ICD-10-CM | POA: Diagnosis not present

## 2021-09-11 DIAGNOSIS — M2540 Effusion, unspecified joint: Secondary | ICD-10-CM | POA: Diagnosis not present

## 2021-09-11 DIAGNOSIS — M25512 Pain in left shoulder: Secondary | ICD-10-CM

## 2021-09-11 DIAGNOSIS — M25541 Pain in joints of right hand: Secondary | ICD-10-CM

## 2021-09-11 DIAGNOSIS — M7989 Other specified soft tissue disorders: Secondary | ICD-10-CM | POA: Diagnosis not present

## 2021-09-11 MED ORDER — DICLOFENAC SODIUM 75 MG PO TBEC: 75.0000 mg | DELAYED_RELEASE_TABLET | Freq: Two times a day (BID) | ORAL | 0 refills | Status: DC

## 2021-09-11 NOTE — Progress Notes (Signed)
Subjective: ? Jon [name] is a 56 y.o. male who presents for ?Chief Complaint  ?Patient presents with  ? other  ?  Rt. Hand swollen and hurting for 2 months and lt. Shoulder pain not much range of motion  ?   ?Right-handed male here for complaints of right hand pain and swelling and left shoulder pain and decreased range of motion ? ?He does boxing for exercise.  He does this typically 2 or 3 days/week.  For the past month or so he has had pain and swelling in his right hand knuckles mainly the second through third fingers.  He cannot fully squeeze and make a grip with his right hand.  Other than punching the bag no specific injury or trauma. ? ?Over the last few weeks his left shoulder has been hurting and has gotten to the point where he cannot fully move and range of motion.  He attributes this to favoring this side while his right hand was hurting.  He has been hitting more with the left hand now when he punches the back. ? ?No arm numbness tingling or weakness.  No neck pain.  He does have a history of rotator cuff surgery on the left shoulder in 2021. ? ?No other aggravating or relieving factors.   ? ?No other c/o. ? ?The following portions of the patient's history were reviewed and updated as appropriate: allergies, current medications, past family history, past medical history, past social history, past surgical history and problem list. ? ?ROS ?Otherwise as in subjective above ? ?Objective: ?BP 120/82   Pulse 68   Temp 98.5 ?F (36.9 ?C)   Wt 240 lb 12.8 oz (109.2 kg)   BMI 30.92 kg/m?  ? ?General appearance: alert, no distress, well developed, well nourished ?Right hand with soft tissue swelling over the second through third MCPs, cannot fully squeeze make a full grip, cannot fully flex the fingers of the right hand except for thumb and small finger.  Otherwise hand nontender, no other deformity or swelling.  No discoloration of skin.  Rest of arm nontender with normal range of motion ?Left shoulder  with decreased range of motion, limited above 100 degrees of flexion or abduction.  Somewhat tender over the anterior shoulder.  Range of motion of the internal and external range of motion reduced on the left.  Pain in general with range of motion above 90 degrees.  Slight weakness observed on pushoff test and rotator cuff test.  Rest of arm nontender with normal range of motion ?Arm sensation normal ?Pulses: 2+ radial pulses, normal cap refill ? ? ? ?Assessment: ?Encounter Diagnoses  ?Name Primary?  ? Left shoulder pain, unspecified chronicity Yes  ? Bursitis of left shoulder   ? Arthralgia of right hand   ? Joint swelling   ? ? ? ?Plan: ?Symptoms suggest tenosynovitis of the right hand.  Symptoms suggest bursitis and tendinitis of the left shoulder. ? ?We discussed the following recommendations which were also printed for patient.  He will go for x-ray of the right hand. ? ?Patient Instructions  ?Right hand pain and swelling ?This most likely represents tenosynovitis or inflammation pain of the joints of the knuckles ?I recommend you use diclofenac anti-inflammatory tablet twice daily for the next 7 days.  Take this with food.  After 7 days you can use this for pain and inflammation as needed ?For the next several days use ice water therapy or bag of frozen peas preferably twice daily into the hand.  Alternatively you could do ice therapy 20 minutes on 20 minutes off several times in the evening for the next several days ?Rest the hand, avoid trauma and boxing with the hand for now ?1 another option would be to consider using a reinforced wrist splint such as a carpal tunnel splint over-the-counter to get some added support to the hand and to help you avoid using the hand for the next 1 to 2 weeks. ? ? ?Left shoulder pain, history of shoulder surgery ?Your current exam findings suggest sprain injury as well as some bursitis ?As in the information above, begin the diclofenac twice daily for 7 days, then as needed  after 7 days ?You can also do cold therapy such as ice water pack over the shoulder 20 minutes on 20 minutes off for the next several days ?I recommend using an arm sling periodically throughout the day for the next week.  You can get 1 of these over-the-counter.  This would allow you to rest the shoulder and not use that arm during part of the day ?Avoid boxing and use of the arm other than what you need to do at work for the next week to give it a chance to rest ?After about 5 days of diclofenac, relative rest, ice, then do some stretching of the shoulder ?Use some of the same stretches you would have used last year after shoulder surgery to help extend your range of motion ?If you do not see a big improvement in the next 10 to 12 days with the left shoulder, you may wind up needing a shot of steroid for inflammation or possibly go back and see orthopedics for other treatment recommendations ? ? ?Christion was seen today for other. ? ?Diagnoses and all orders for this visit: ? ?Left shoulder pain, unspecified chronicity ? ?Bursitis of left shoulder ? ?Arthralgia of right hand ?-     DG Hand Complete Right; Future ? ?Joint swelling ?-     DG Hand Complete Right; Future ? ?Other orders ?-     diclofenac (VOLTAREN) 75 MG EC tablet; Take 1 tablet (75 mg total) by mouth 2 (two) times daily. ? ? ? ?Follow up: pending xray ?

## 2021-09-11 NOTE — Patient Instructions (Signed)
Right hand pain and swelling ?This most likely represents tenosynovitis or inflammation pain of the joints of the knuckles ?I recommend you use diclofenac anti-inflammatory tablet twice daily for the next 7 days.  Take this with food.  After 7 days you can use this for pain and inflammation as needed ?For the next several days use ice water therapy or bag of frozen peas preferably twice daily into the hand.  Alternatively you could do ice therapy 20 minutes on 20 minutes off several times in the evening for the next several days ?Rest the hand, avoid trauma and boxing with the hand for now ?1 another option would be to consider using a reinforced wrist splint such as a carpal tunnel splint over-the-counter to get some added support to the hand and to help you avoid using the hand for the next 1 to 2 weeks. ? ? ?Left shoulder pain, history of shoulder surgery ?Your current exam findings suggest sprain injury as well as some bursitis ?As in the information above, begin the diclofenac twice daily for 7 days, then as needed after 7 days ?You can also do cold therapy such as ice water pack over the shoulder 20 minutes on 20 minutes off for the next several days ?I recommend using an arm sling periodically throughout the day for the next week.  You can get 1 of these over-the-counter.  This would allow you to rest the shoulder and not use that arm during part of the day ?Avoid boxing and use of the arm other than what you need to do at work for the next week to give it a chance to rest ?After about 5 days of diclofenac, relative rest, ice, then do some stretching of the shoulder ?Use some of the same stretches you would have used last year after shoulder surgery to help extend your range of motion ?If you do not see a big improvement in the next 10 to 12 days with the left shoulder, you may wind up needing a shot of steroid for inflammation or possibly go back and see orthopedics for other treatment recommendations ?

## 2021-09-13 ENCOUNTER — Other Ambulatory Visit: Payer: Self-pay | Admitting: Medical

## 2021-09-16 DIAGNOSIS — F4320 Adjustment disorder, unspecified: Secondary | ICD-10-CM | POA: Diagnosis not present

## 2021-09-30 DIAGNOSIS — F4320 Adjustment disorder, unspecified: Secondary | ICD-10-CM | POA: Diagnosis not present

## 2021-10-02 DIAGNOSIS — S63652A Sprain of metacarpophalangeal joint of right middle finger, initial encounter: Secondary | ICD-10-CM | POA: Diagnosis not present

## 2021-10-07 DIAGNOSIS — F4320 Adjustment disorder, unspecified: Secondary | ICD-10-CM | POA: Diagnosis not present

## 2021-10-14 DIAGNOSIS — F4320 Adjustment disorder, unspecified: Secondary | ICD-10-CM | POA: Diagnosis not present

## 2021-10-15 DIAGNOSIS — Z89511 Acquired absence of right leg below knee: Secondary | ICD-10-CM | POA: Diagnosis not present

## 2021-10-21 DIAGNOSIS — F4320 Adjustment disorder, unspecified: Secondary | ICD-10-CM | POA: Diagnosis not present

## 2021-10-28 ENCOUNTER — Ambulatory Visit (INDEPENDENT_AMBULATORY_CARE_PROVIDER_SITE_OTHER): Payer: BC Managed Care – PPO | Admitting: Cardiology

## 2021-10-28 ENCOUNTER — Encounter: Payer: Self-pay | Admitting: Cardiology

## 2021-10-28 VITALS — BP 130/80 | HR 75 | Ht 74.0 in | Wt 240.0 lb

## 2021-10-28 DIAGNOSIS — I4819 Other persistent atrial fibrillation: Secondary | ICD-10-CM | POA: Diagnosis not present

## 2021-10-28 DIAGNOSIS — I480 Paroxysmal atrial fibrillation: Secondary | ICD-10-CM | POA: Diagnosis not present

## 2021-10-28 DIAGNOSIS — Z01812 Encounter for preprocedural laboratory examination: Secondary | ICD-10-CM
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
## 2021-10-29 NOTE — Addendum Note (Signed)
Addended by: Stanton Kidney on: 10/29/2021 07:59 AM   Modules accepted: Orders

## 2021-11-18 DIAGNOSIS — F4320 Adjustment disorder, unspecified: Secondary | ICD-10-CM | POA: Diagnosis not present

## 2021-11-25 DIAGNOSIS — F4320 Adjustment disorder, unspecified: Secondary | ICD-10-CM | POA: Diagnosis not present

## 2021-12-02 DIAGNOSIS — F4320 Adjustment disorder, unspecified: Secondary | ICD-10-CM | POA: Diagnosis not present

## 2021-12-09 DIAGNOSIS — F4320 Adjustment disorder, unspecified: Secondary | ICD-10-CM | POA: Diagnosis not present

## 2021-12-16 DIAGNOSIS — F4320 Adjustment disorder, unspecified: Secondary | ICD-10-CM | POA: Diagnosis not present

## 2021-12-17 ENCOUNTER — Telehealth: Payer: Self-pay | Admitting: Medical

## 2021-12-17 NOTE — Telephone Encounter (Signed)
Melissa with Therapist, [employer] called and states that she has faxed over orders for this pt twice. These are for liners for his prosthetic. She wanted to check on status. Please advise at [facility] at [phone].

## 2021-12-17 NOTE — Telephone Encounter (Signed)
[facility] did receive the paperwork for pt already

## 2021-12-23 DIAGNOSIS — F4320 Adjustment disorder, unspecified: Secondary | ICD-10-CM | POA: Diagnosis not present

## 2021-12-24 DIAGNOSIS — Z89511 Acquired absence of right leg below knee: Secondary | ICD-10-CM | POA: Diagnosis not present

## 2021-12-30 DIAGNOSIS — F4320 Adjustment disorder, unspecified: Secondary | ICD-10-CM | POA: Diagnosis not present

## 2022-01-06 DIAGNOSIS — F4320 Adjustment disorder, unspecified: Secondary | ICD-10-CM | POA: Diagnosis not present

## 2022-01-07 DIAGNOSIS — H40013 Open angle with borderline findings, low risk, bilateral: Secondary | ICD-10-CM | POA: Diagnosis not present

## 2022-01-13 DIAGNOSIS — F4320 Adjustment disorder, unspecified: Secondary | ICD-10-CM | POA: Diagnosis not present

## 2022-01-20 DIAGNOSIS — F4320 Adjustment disorder, unspecified: Secondary | ICD-10-CM | POA: Diagnosis not present

## 2022-01-21 ENCOUNTER — Encounter: Payer: Self-pay | Admitting: Internal Medicine

## 2022-01-27 DIAGNOSIS — F4320 Adjustment disorder, unspecified: Secondary | ICD-10-CM | POA: Diagnosis not present

## 2022-02-03 DIAGNOSIS — F4320 Adjustment disorder, unspecified: Secondary | ICD-10-CM | POA: Diagnosis not present

## 2022-02-10 DIAGNOSIS — F4320 Adjustment disorder, unspecified: Secondary | ICD-10-CM | POA: Diagnosis not present

## 2022-02-24 ENCOUNTER — Encounter: Payer: Self-pay | Admitting: Internal Medicine

## 2022-03-04 ENCOUNTER — Ambulatory Visit (HOSPITAL_COMMUNITY)

## 2022-04-08 ENCOUNTER — Ambulatory Visit (HOSPITAL_COMMUNITY): Payer: BC Managed Care – PPO | Admitting: Physician Assistant

## 2022-04-11 ENCOUNTER — Other Ambulatory Visit: Payer: Self-pay | Admitting: Cardiology

## 2022-06-10 ENCOUNTER — Telehealth: Payer: Self-pay

## 2022-06-10 NOTE — Telephone Encounter (Signed)
Called pt to go over Ablation Instructions and he requested it be moved out to a later date due to Insurance reasons.   I moved him out to 10/15/22 and scheduled him a f/u with Camnitz on 09/15/22.

## 2022-06-23 ENCOUNTER — Ambulatory Visit (HOSPITAL_BASED_OUTPATIENT_CLINIC_OR_DEPARTMENT_OTHER)
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
## 2022-09-11 ENCOUNTER — Telehealth: Payer: Self-pay | Admitting: Cardiology

## 2022-09-11 ENCOUNTER — Ambulatory Visit: Admitting: Cardiology

## 2022-09-11 NOTE — Telephone Encounter (Signed)
Calling to reschedule his ablatio on 5/30. Please advise

## 2022-09-14 NOTE — Telephone Encounter (Signed)
I called pt and he requested to move his ablation out again. He was scheduled for 10/15/22. I moved him out to 01/13/23. I have sent a message to CT, Pre-op and [place] to update those appts.   Sherri, Will the pt need to f/u with WC or an APP prior to this ablation? He was last seen on 10/28/21.  Pt aware that I will be in touch with him at a later date to get everything scheduled.

## 2022-09-15 ENCOUNTER — Ambulatory Visit: Admitting: Cardiology

## 2022-09-15 ENCOUNTER — Other Ambulatory Visit: Payer: Self-pay

## 2022-09-15 DIAGNOSIS — I4819 Other persistent atrial fibrillation: Secondary | ICD-10-CM

## 2022-09-15 NOTE — Telephone Encounter (Signed)
Per WC he will need appt prior to ablation since it was moved out to 8/28 - he has been scheduled to see WC on 12/22/22

## 2022-10-08 ENCOUNTER — Other Ambulatory Visit (HOSPITAL_BASED_OUTPATIENT_CLINIC_OR_DEPARTMENT_OTHER)

## 2022-10-30 ENCOUNTER — Telehealth: Payer: Self-pay | Admitting: Medical

## 2022-10-30 NOTE — Telephone Encounter (Signed)
Jon [name] walked in and needs a prescription to be able to buy prosthetic supplies from [employer].

## 2022-11-12 ENCOUNTER — Ambulatory Visit (HOSPITAL_COMMUNITY): Admitting: Physician Assistant

## 2022-12-22 ENCOUNTER — Encounter: Payer: Self-pay | Admitting: Cardiology

## 2022-12-22 ENCOUNTER — Ambulatory Visit: Attending: Cardiology | Admitting: Cardiology

## 2022-12-22 VITALS — BP 150/100 | HR 67 | Ht 74.0 in | Wt 223.0 lb

## 2022-12-22 DIAGNOSIS — I480 Paroxysmal atrial fibrillation: Secondary | ICD-10-CM | POA: Diagnosis not present

## 2022-12-22 DIAGNOSIS — Z01812 Encounter for preprocedural laboratory examination: Secondary | ICD-10-CM | POA: Diagnosis not present

## 2022-12-22 DIAGNOSIS — I483 Typical atrial flutter: Secondary | ICD-10-CM

## 2022-12-22 LAB — BASIC METABOLIC PANEL
BUN/Creatinine Ratio: 15 (ref 9–20)
BUN: 11 mg/dL (ref 6–24)
CO2: 24 mmol/L (ref 20–29)
Calcium: 9 mg/dL (ref 8.7–10.2)
Chloride: 102 mmol/L (ref 96–106)
Creatinine, Ser: 0.74 mg/dL — ABNORMAL LOW (ref 0.76–1.27)
Glucose: 136 mg/dL — ABNORMAL HIGH (ref 70–99)
Potassium: 4 mmol/L (ref 3.5–5.2)
Sodium: 141 mmol/L (ref 134–144)
eGFR: 106 mL/min/{1.73_m2} (ref >59)

## 2022-12-22 LAB — CBC

## 2022-12-22 MED ORDER — APIXABAN 5 MG PO TABS: 5.0000 mg | ORAL_TABLET | Freq: Two times a day (BID) | ORAL | Status: DC

## 2022-12-22 MED ORDER — APIXABAN 5 MG PO TABS: 5.0000 mg | ORAL_TABLET | Freq: Two times a day (BID) | ORAL | 6 refills | Status: DC

## 2022-12-22 NOTE — Progress Notes (Signed)
  Electrophysiology Office Note:   Date:  12/22/2022  ID:  Jon [name], DOB 1965/08/24, MRN [id number]  Primary Cardiologist: None Electrophysiologist: Nike Southers Jorja Loa, MD      History of Present Illness:   Jon [name] is a 57 y.o. male with h/o atrial fibrillation seen today for routine electrophysiology followup.  Since last being seen in our clinic the patient reports doing well.  He continues to have short palpitations, but otherwise feels well.  He has no chest pain or shortness of breath.  He has plans for repeat ablation 01/13/2023.  he denies chest pain, palpitations, dyspnea, PND, orthopnea, nausea, vomiting, dizziness, syncope, edema, weight gain, or early satiety.      He has a history of atrial flutter status post ablation 06/14/2015. He developed subsequent palpitations and found to have atrial fibrillation. He is status post atrial fibrillation ablation 11/29/2018. He unfortunately continued to have palpitations and is now on flecainide.        Review of systems complete and found to be negative unless listed in HPI.   EP Information / Studies Reviewed:    EKG is ordered today. Personal review as below.  EKG Interpretation Date/Time:  Tuesday December 22 2022 09:11:36 EDT Ventricular Rate:  67 PR Interval:  174 QRS Duration:  120 QT Interval:  416 QTC Calculation: 439 R Axis:   81  Text Interpretation: Normal sinus rhythm RSR' or QR pattern in V1 suggests right ventricular conduction delay When compared with ECG of 30-Dec-2018 08:14, QRS duration has increased Confirmed by Sharicka Pogorzelski ([id number]) on 12/22/2022 9:23:55 AM     Risk Assessment/Calculations:    CHA2DS2-VASc Score = 0   This indicates a 0.2% annual risk of stroke. The patient's score is based upon: CHF History: 0 HTN History: 0 Diabetes History: 0 Stroke History: 0 Vascular Disease History: 0 Age Score: 0 Gender Score: 0     Physical Exam:   VS:  BP (!) 150/100 (BP Location: Left Arm,  Patient Position: Sitting, Cuff Size: Large)   Pulse 67   Ht 6\' 2"  (1.88 m)   Wt 223 lb (101.2 kg)   SpO2 96%   BMI 28.63 kg/m    Wt Readings from Last 3 Encounters:  12/22/22 223 lb (101.2 kg)  10/28/21 240 lb (108.9 kg)  09/11/21 240 lb 12.8 oz (109.2 kg)     GEN: Well nourished, well developed in no acute distress NECK: No JVD; No carotid bruits CARDIAC: Regular rate and rhythm, no murmurs, rubs, gallops RESPIRATORY:  Clear to auscultation without rales, wheezing or rhonchi  ABDOMEN: Soft, non-tender, non-distended EXTREMITIES:  No edema; No deformity   ASSESSMENT AND PLAN:    1.  Paroxysmal atrial fibrillation: Currently on flecainide and metoprolol.  Giovana Faciane start Eliquis today and prep for ablation.  Ablation scheduled 01/09/2023.  All questions were answered today.  Tanara Turvey check preprocedure labs today.  CT scan prior to ablation.  2.  Typical atrial flutter: Post ablation 120 2717.  No obvious recurrence.  Follow up with Dr. Elberta Fortis as usual post procedure  Signed, Taiga Lupinacci Jorja Loa, MD

## 2022-12-22 NOTE — Addendum Note (Signed)
Addended by: Baird Lyons on: 12/22/2022 01:10 PM   Modules accepted: Orders

## 2022-12-22 NOTE — Patient Instructions (Addendum)
Medication Instructions:  Your physician has recommended you make the following change in your medication:  START Eliquis 5 mg twice daily -- start tonight  *If you need a refill on your cardiac medications before your next appointment, please call your pharmacy*   Lab Work: Today: BMET & CBC If you have labs (blood work) drawn today and your tests are completely normal, you will receive your results only by: MyChart Message (if you have MyChart) OR A paper copy in the mail If you have any lab test that is abnormal or we need to change your treatment, we will call you to review the results.   Testing/Procedures: None ordered   Follow-Up: At [facility], you and your health needs are our priority.  As part of our continuing mission to provide you with exceptional heart care, we have created designated Provider Care Teams.  These Care Teams include your primary Cardiologist (physician) and Advanced Practice Providers (APPs -  Physician Assistants and Nurse Practitioners) who all work together to provide you with the care you need, when you need it.  Your next appointment:   4 week(s) after your ablation  The format for your next appointment:   In Person  Provider:   You will follow up in the Atrial Fibrillation Clinic located at [facility]. Your provider will be: Clint R. Fenton, PA-C  Or Landry Mellow, PA   Thank you for choosing [facility]!!   Dory Horn, RN [phone]

## 2022-12-22 NOTE — H&P (View-Only) (Signed)
  Electrophysiology Office Note:   Date:  12/22/2022  ID:  Elisandro Wyka, DOB 1965/08/24, MRN [id number]  Primary Cardiologist: None Electrophysiologist: Will Jorja Loa, MD      History of Present Illness:   Jon [name] is a 57 y.o. male with h/o atrial fibrillation seen today for routine electrophysiology followup.  Since last being seen in our clinic the patient reports doing well.  He continues to have short palpitations, but otherwise feels well.  He has no chest pain or shortness of breath.  He has plans for repeat ablation 01/13/2023.  he denies chest pain, palpitations, dyspnea, PND, orthopnea, nausea, vomiting, dizziness, syncope, edema, weight gain, or early satiety.      He has a history of atrial flutter status post ablation 06/14/2015. He developed subsequent palpitations and found to have atrial fibrillation. He is status post atrial fibrillation ablation 11/29/2018. He unfortunately continued to have palpitations and is now on flecainide.        Review of systems complete and found to be negative unless listed in HPI.   EP Information / Studies Reviewed:    EKG is ordered today. Personal review as below.  EKG Interpretation Date/Time:  Tuesday December 22 2022 09:11:36 EDT Ventricular Rate:  67 PR Interval:  174 QRS Duration:  120 QT Interval:  416 QTC Calculation: 439 R Axis:   81  Text Interpretation: Normal sinus rhythm RSR' or QR pattern in V1 suggests right ventricular conduction delay When compared with ECG of 30-Dec-2018 08:14, QRS duration has increased Confirmed by Camnitz, Will ([id number]) on 12/22/2022 9:23:55 AM     Risk Assessment/Calculations:    CHA2DS2-VASc Score = 0   This indicates a 0.2% annual risk of stroke. The patient's score is based upon: CHF History: 0 HTN History: 0 Diabetes History: 0 Stroke History: 0 Vascular Disease History: 0 Age Score: 0 Gender Score: 0     Physical Exam:   VS:  BP (!) 150/100 (BP Location: Left Arm,  Patient Position: Sitting, Cuff Size: Large)   Pulse 67   Ht 6\' 2"  (1.88 m)   Wt 223 lb (101.2 kg)   SpO2 96%   BMI 28.63 kg/m    Wt Readings from Last 3 Encounters:  12/22/22 223 lb (101.2 kg)  10/28/21 240 lb (108.9 kg)  09/11/21 240 lb 12.8 oz (109.2 kg)     GEN: Well nourished, well developed in no acute distress NECK: No JVD; No carotid bruits CARDIAC: Regular rate and rhythm, no murmurs, rubs, gallops RESPIRATORY:  Clear to auscultation without rales, wheezing or rhonchi  ABDOMEN: Soft, non-tender, non-distended EXTREMITIES:  No edema; No deformity   ASSESSMENT AND PLAN:    1.  Paroxysmal atrial fibrillation: Currently on flecainide and metoprolol.  Will start Eliquis today and prep for ablation.  Ablation scheduled 01/09/2023.  All questions were answered today.  Will check preprocedure labs today.  CT scan prior to ablation.  2.  Typical atrial flutter: Post ablation 120 2717.  No obvious recurrence.  Follow up with Dr. Elberta Fortis as usual post procedure  Signed, Will Jorja Loa, MD

## 2023-01-01 ENCOUNTER — Other Ambulatory Visit: Payer: Self-pay | Admitting: Cardiology

## 2023-01-06 ENCOUNTER — Ambulatory Visit (HOSPITAL_COMMUNITY)
Admission: RE | Admit: 2023-01-06 | Discharge: 2023-01-06 | Disposition: A | Discharge status: Home / Self Care | Source: Ambulatory Visit | Attending: Cardiology | Admitting: Cardiology

## 2023-01-06 DIAGNOSIS — I4819 Other persistent atrial fibrillation: Secondary | ICD-10-CM | POA: Diagnosis present

## 2023-01-06 MED ORDER — IOHEXOL 350 MG/ML SOLN
95.0000 mL | Freq: Once | INTRAVENOUS | Status: AC | PRN
  Administered 2023-01-06: 95 mL via INTRAVENOUS

## 2023-01-12 NOTE — Anesthesia Preprocedure Evaluation (Signed)
Anesthesia Evaluation  Patient identified by MRN, date of birth, ID band Patient awake    Reviewed: Allergy & Precautions, NPO status , Patient's Chart, lab work & pertinent test results  History of Anesthesia Complications Negative for: history of anesthetic complications  Airway Mallampati: II  TM Distance: >3 FB Neck ROM: Full    Dental  (+) Teeth Intact, Dental Advisory Given   Pulmonary sleep apnea and Continuous Positive Airway Pressure Ventilation    breath sounds clear to auscultation       Cardiovascular (-) angina + dysrhythmias Atrial Fibrillation  Rhythm:Regular Rate:Normal  '17 ECHO: EF 55-60%, normal LVF, normal RVF, mild MR   Neuro/Psych negative neurological ROS     GI/Hepatic negative GI ROS, Neg liver ROS,,,  Endo/Other  BMI 31  Renal/GU negative Renal ROS     Musculoskeletal   Abdominal   Peds  Hematology eliquis   Anesthesia Other Findings   Reproductive/Obstetrics                             Anesthesia Physical Anesthesia Plan  ASA: 3  Anesthesia Plan: General   Post-op Pain Management: Tylenol PO (pre-op)* and Minimal or no pain anticipated   Induction: Intravenous  PONV Risk Score and Plan: 2  Airway Management Planned: Oral ETT  Additional Equipment: None  Intra-op Plan:   Post-operative Plan: Extubation in OR  Informed Consent: I have reviewed the patients History and Physical, chart, labs and discussed the procedure including the risks, benefits and alternatives for the proposed anesthesia with the patient or authorized representative who has indicated his/her understanding and acceptance.     Dental advisory given  Plan Discussed with: CRNA and Surgeon  Anesthesia Plan Comments:         Anesthesia Quick Evaluation

## 2023-01-12 NOTE — Pre-Procedure Instructions (Signed)
Instructed patient on the following items: Arrival time 0800 Nothing to eat or drink after midnight No meds AM of procedure Responsible person to drive you home and stay with you for 24 hrs  Have you missed any doses of anti-coagulant Eliquis- should be taken twice a day, please let us know if you have missed any doses.  Don't take morning of procedure.

## 2023-01-13 ENCOUNTER — Ambulatory Visit (HOSPITAL_COMMUNITY)
Admission: RE | Admit: 2023-01-13 | Discharge: 2023-01-13 | Disposition: A | Discharge status: Home / Self Care | Payer: 59 | Attending: Cardiology | Admitting: Cardiology

## 2023-01-13 ENCOUNTER — Other Ambulatory Visit: Payer: Self-pay

## 2023-01-13 ENCOUNTER — Ambulatory Visit (HOSPITAL_COMMUNITY): Payer: 59 | Admitting: Anesthesiology

## 2023-01-13 ENCOUNTER — Encounter (HOSPITAL_COMMUNITY): Admission: RE | Disposition: A | Discharge status: Home / Self Care | Source: Home / Self Care | Attending: Cardiology

## 2023-01-13 DIAGNOSIS — I483 Typical atrial flutter: Secondary | ICD-10-CM | POA: Diagnosis not present

## 2023-01-13 DIAGNOSIS — I480 Paroxysmal atrial fibrillation: Secondary | ICD-10-CM | POA: Diagnosis present

## 2023-01-13 HISTORY — PX: ATRIAL FIBRILLATION ABLATION: EP1191

## 2023-01-13 LAB — POCT ACTIVATED CLOTTING TIME
Activated Clotting Time: 299 seconds
Activated Clotting Time: 299 seconds

## 2023-01-13 SURGERY — ATRIAL FIBRILLATION ABLATION
Anesthesia: General

## 2023-01-13 MED ORDER — DEXAMETHASONE SODIUM PHOSPHATE 10 MG/ML IJ SOLN
INTRAMUSCULAR | Status: DC | PRN
  Administered 2023-01-13: 10 mg via INTRAVENOUS

## 2023-01-13 MED ORDER — HEPARIN SODIUM (PORCINE) 1000 UNIT/ML IJ SOLN
INTRAMUSCULAR | Status: DC | PRN
  Administered 2023-01-13: 4000 [IU] via INTRAVENOUS
  Administered 2023-01-13: 14000 [IU] via INTRAVENOUS
  Administered 2023-01-13: 3000 [IU] via INTRAVENOUS

## 2023-01-13 MED ORDER — SODIUM CHLORIDE 0.9% FLUSH: 3.0000 mL | INTRAVENOUS | Status: DC | PRN

## 2023-01-13 MED ORDER — PROTAMINE SULFATE 10 MG/ML IV SOLN
INTRAVENOUS | Status: DC | PRN
Start: 2023-01-13 — End: 2023-01-13
  Administered 2023-01-13: 40 mg via INTRAVENOUS

## 2023-01-13 MED ORDER — ROCURONIUM BROMIDE 10 MG/ML (PF) SYRINGE
PREFILLED_SYRINGE | INTRAVENOUS | Status: DC | PRN
  Administered 2023-01-13: 30 mg via INTRAVENOUS
  Administered 2023-01-13: 60 mg via INTRAVENOUS

## 2023-01-13 MED ORDER — ATROPINE SULFATE 1 MG/ML IV SOLN
INTRAVENOUS | Status: DC | PRN
  Administered 2023-01-13: 1 mg via INTRAVENOUS

## 2023-01-13 MED ORDER — HEPARIN (PORCINE) IN NACL 1000-0.9 UT/500ML-% IV SOLN
INTRAVENOUS | Status: DC | PRN
  Administered 2023-01-13 (×3): 500 mL

## 2023-01-13 MED ORDER — FENTANYL CITRATE (PF) 250 MCG/5ML IJ SOLN
INTRAMUSCULAR | Status: DC | PRN
  Administered 2023-01-13: 100 ug via INTRAVENOUS

## 2023-01-13 MED ORDER — SUGAMMADEX SODIUM 200 MG/2ML IV SOLN
INTRAVENOUS | Status: DC | PRN
  Administered 2023-01-13: 200 mg via INTRAVENOUS

## 2023-01-13 MED ORDER — ONDANSETRON HCL 4 MG/2ML IJ SOLN: 4.0000 mg | Freq: Four times a day (QID) | INTRAMUSCULAR | Status: DC | PRN

## 2023-01-13 MED ORDER — MIDAZOLAM HCL 2 MG/2ML IJ SOLN
INTRAMUSCULAR | Status: DC | PRN
  Administered 2023-01-13: 2 mg via INTRAVENOUS

## 2023-01-13 MED ORDER — ACETAMINOPHEN 325 MG PO TABS: 650.0000 mg | ORAL_TABLET | ORAL | Status: DC | PRN

## 2023-01-13 MED ORDER — ACETAMINOPHEN 500 MG PO TABS
1000.0000 mg | ORAL_TABLET | Freq: Once | ORAL | Status: AC
  Administered 2023-01-13: 1000 mg via ORAL
  Filled 2023-01-13: qty 2

## 2023-01-13 MED ORDER — ONDANSETRON HCL 4 MG/2ML IJ SOLN
INTRAMUSCULAR | Status: DC | PRN
  Administered 2023-01-13: 4 mg via INTRAVENOUS

## 2023-01-13 MED ORDER — SODIUM CHLORIDE 0.9 % IV SOLN: 250.0000 mL | INTRAVENOUS | Status: DC | PRN

## 2023-01-13 MED ORDER — SODIUM CHLORIDE 0.9 % IV SOLN: INTRAVENOUS | Status: DC

## 2023-01-13 MED ORDER — LIDOCAINE 2% (20 MG/ML) 5 ML SYRINGE
INTRAMUSCULAR | Status: DC | PRN
  Administered 2023-01-13: 100 mg via INTRAVENOUS

## 2023-01-13 MED ORDER — PROPOFOL 10 MG/ML IV BOLUS
INTRAVENOUS | Status: DC | PRN
  Administered 2023-01-13: 30 mg via INTRAVENOUS
  Administered 2023-01-13: 170 mg via INTRAVENOUS

## 2023-01-13 SURGICAL SUPPLY — 25 items
BAG SNAP BAND KOVER 36X36 (MISCELLANEOUS) IMPLANT
BLANKET WARM UNDERBOD FULL ACC (MISCELLANEOUS) ×1 IMPLANT
CABLE PFA RX CATH CONN (CABLE) IMPLANT
CATH FARAWAVE ABLATION 31 (CATHETERS) IMPLANT
CATH OCTARAY 2.0 F 3-3-3-3-3 (CATHETERS) IMPLANT
CATH SOUNDSTAR ECO 8FR (CATHETERS) IMPLANT
CATH WEBSTER BI DIR CS D-F CRV (CATHETERS) IMPLANT
CLOSURE MYNX CONTROL 6F/7F (Vascular Products) IMPLANT
CLOSURE PERCLOSE PROSTYLE (VASCULAR PRODUCTS) IMPLANT
COVER SWIFTLINK CONNECTOR (BAG) ×1 IMPLANT
DILATOR VESSEL 38 20CM 12FR (INTRODUCER) IMPLANT
DILATOR VESSEL 38 20CM 16FR (INTRODUCER) IMPLANT
GUIDEWIRE INQWIRE 1.5J.035X260 (WIRE) IMPLANT
INQWIRE 1.5J .035X260CM (WIRE) ×1
MAT PREVALON FULL STRYKER (MISCELLANEOUS) IMPLANT
PACK EP LATEX FREE (CUSTOM PROCEDURE TRAY) ×1
PACK EP LF (CUSTOM PROCEDURE TRAY) ×1 IMPLANT
PAD DEFIB RADIO PHYSIO CONN (PAD) ×1 IMPLANT
PATCH CARTO3 (PAD) IMPLANT
SHEATH FARADRIVE STEERABLE (SHEATH) IMPLANT
SHEATH PINNACLE 7F 10CM (SHEATH) IMPLANT
SHEATH PINNACLE 8F 10CM (SHEATH) IMPLANT
SHEATH PINNACLE 9F 10CM (SHEATH) IMPLANT
SHEATH PROBE COVER 6X72 (BAG) IMPLANT
SHEATH WIRE KIT BAYLIS SL1 (KITS) IMPLANT

## 2023-01-13 NOTE — Transfer of Care (Signed)
Immediate Anesthesia Transfer of Care Note  Patient: Jon [name]  Procedure(s) Performed: ATRIAL FIBRILLATION ABLATION  Patient Location: PACU and Cath Lab  Anesthesia Type:General  Level of Consciousness: awake, alert , oriented, and patient cooperative  Airway & Oxygen Therapy: Patient Spontanous Breathing and Patient connected to face mask oxygen  Post-op Assessment: Report given to RN, Post -op Vital signs reviewed and stable, and Patient moving all extremities X 4  Post vital signs: Reviewed and stable  Last Vitals:  Vitals Value Taken Time  BP 128/83 01/13/23 1245  Temp 36.7 C 01/13/23 1237  Pulse 65 01/13/23 1245  Resp 13 01/13/23 1245  SpO2 95 % 01/13/23 1245  Vitals shown include unfiled device data.  Last Pain:  Vitals:   01/13/23 1237  TempSrc: Temporal  PainSc: 5          Complications: There were no known notable events for this encounter.

## 2023-01-13 NOTE — Anesthesia Postprocedure Evaluation (Signed)
Anesthesia Post Note  Patient: Jon [name]  Procedure(s) Performed: ATRIAL FIBRILLATION ABLATION     Patient location during evaluation: Cath Lab Anesthesia Type: General Level of consciousness: awake and alert, patient cooperative and oriented Pain management: pain level controlled Vital Signs Assessment: post-procedure vital signs reviewed and stable Respiratory status: spontaneous breathing, nonlabored ventilation, respiratory function stable and patient connected to nasal cannula oxygen Cardiovascular status: blood pressure returned to baseline and stable Postop Assessment: no apparent nausea or vomiting Anesthetic complications: no   There were no known notable events for this encounter.  Last Vitals:  Vitals:   01/13/23 1330 01/13/23 1345  BP: 128/84 123/89  Pulse: (!) 58 (!) 56  Resp: 12 14  Temp:    SpO2: 91% 93%    Last Pain:  Vitals:   01/13/23 1331  TempSrc:   PainSc: 0-No pain                 Kymani Laursen,E. Angelicia Lessner

## 2023-01-13 NOTE — Anesthesia Procedure Notes (Signed)
Procedure Name: Intubation Date/Time: 01/13/2023 10:53 AM  Performed by: Jairo Ben, MDPre-anesthesia Checklist: Patient identified, Emergency Drugs available, Suction available and Patient being monitored Patient Re-evaluated:Patient Re-evaluated prior to induction Oxygen Delivery Method: Circle system utilized Preoxygenation: Pre-oxygenation with 100% oxygen Induction Type: IV induction Ventilation: Mask ventilation without difficulty Laryngoscope Size: Mac and 4 Grade View: Grade II Tube type: Oral Tube size: 7.5 mm Number of attempts: 1 Airway Equipment and Method: Stylet and Oral airway Placement Confirmation: ETT inserted through vocal cords under direct vision, positive ETCO2 and breath sounds checked- equal and bilateral Secured at: 23 cm Tube secured with: Tape Dental Injury: Teeth and Oropharynx as per pre-operative assessment

## 2023-01-13 NOTE — Discharge Instructions (Signed)

## 2023-01-13 NOTE — Interval H&P Note (Signed)
History and Physical Interval Note:  01/13/2023 9:58 AM  Jon [name]  has presented today for surgery, with the diagnosis of afib.  The various methods of treatment have been discussed with the patient and family. After consideration of risks, benefits and other options for treatment, the patient has consented to  Procedure(s): ATRIAL FIBRILLATION ABLATION (N/A) as a surgical intervention.  The patient's history has been reviewed, patient examined, no change in status, stable for surgery.  I have reviewed the patient's chart and labs.  Questions were answered to the patient's satisfaction.     Jj Enyeart [employer]

## 2023-01-14 ENCOUNTER — Encounter (HOSPITAL_COMMUNITY): Payer: Self-pay | Admitting: Cardiology

## 2023-01-15 ENCOUNTER — Ambulatory Visit: Admitting: Cardiology

## 2023-01-25 ENCOUNTER — Encounter: Payer: Self-pay | Admitting: Cardiology

## 2023-02-02 ENCOUNTER — Encounter: Payer: Self-pay | Admitting: Medical

## 2023-02-02 ENCOUNTER — Ambulatory Visit (INDEPENDENT_AMBULATORY_CARE_PROVIDER_SITE_OTHER): Payer: 59 | Admitting: Medical

## 2023-02-02 VITALS — BP 120/80 | HR 70 | Ht 74.0 in | Wt 232.8 lb

## 2023-02-02 DIAGNOSIS — Z89511 Acquired absence of right leg below knee: Secondary | ICD-10-CM

## 2023-02-02 DIAGNOSIS — Z0000 Encounter for general adult medical examination without abnormal findings: Secondary | ICD-10-CM | POA: Insufficient documentation

## 2023-02-02 DIAGNOSIS — E781 Pure hyperglyceridemia: Secondary | ICD-10-CM | POA: Diagnosis not present

## 2023-02-02 DIAGNOSIS — R7301 Impaired fasting glucose: Secondary | ICD-10-CM

## 2023-02-02 DIAGNOSIS — Z1211 Encounter for screening for malignant neoplasm of colon: Secondary | ICD-10-CM | POA: Diagnosis not present

## 2023-02-02 DIAGNOSIS — Z125 Encounter for screening for malignant neoplasm of prostate: Secondary | ICD-10-CM | POA: Diagnosis not present

## 2023-02-02 DIAGNOSIS — Z6829 Body mass index (BMI) 29.0-29.9, adult: Secondary | ICD-10-CM | POA: Diagnosis not present

## 2023-02-02 DIAGNOSIS — I4819 Other persistent atrial fibrillation: Secondary | ICD-10-CM

## 2023-02-02 DIAGNOSIS — G4733 Obstructive sleep apnea (adult) (pediatric): Secondary | ICD-10-CM | POA: Diagnosis not present

## 2023-02-02 LAB — POCT URINALYSIS DIP (PROADVANTAGE DEVICE)
Bilirubin, UA: NEGATIVE
Blood, UA: NEGATIVE
Glucose, UA: NEGATIVE mg/dL
Ketones, POC UA: NEGATIVE mg/dL
Leukocytes, UA: NEGATIVE
Nitrite, UA: NEGATIVE
Protein Ur, POC: NEGATIVE mg/dL
Specific Gravity, Urine: 1.005
Urobilinogen, Ur: NEGATIVE
pH, UA: 7.5 (ref 5.0–8.0)
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
## 2023-02-02 NOTE — Patient Instructions (Signed)
This visit was a preventative care visit, also known as wellness visit or routine physical.   Topics typically include healthy lifestyle, diet, exercise, preventative care, vaccinations, sick and well care, proper use of emergency dept and after hours care, as well as other concerns.     Separate significant issues discussed: OSA - compliant with CPAP  Hx/o persistent afib, 3 prior ablations, most recent one in 12/2022.  In normal sinus rhythm today  Impaired glucose-updated labs today  Elevated lipids on prior labs and CT coronary test recently does show some atherosclerosis.  Advise he consider statin.  Updated lipid blood test today  Status post below-knee amputation on the right-doing fine with current prosthesis.  Fit with prosthesis in does update supplies regularly and may need to have some assessment with his medical device company in the spring    General Recommendations: Continue to return yearly for your annual wellness and preventative care visits.  This gives [place] a chance to discuss healthy lifestyle, exercise, vaccinations, review your chart record, and perform screenings where appropriate.  I recommend you see your eye doctor yearly for routine vision care.  I recommend you see your dentist yearly for routine dental care including hygiene visits twice yearly.   Vaccination  Immunization History  Administered Date(s) Administered   Influenza Split 02/24/2021   Influenza,inj,Quad PF,6+ Mos 05/07/2015, 03/26/2016, 02/18/2019   Influenza-Unspecified 02/06/2018   Pneumococcal Polysaccharide-23 04/11/2018   Tdap 04/05/2018   Zoster Recombinant(Shingrix) 04/11/2018, 07/12/2018    Vaccine recommendations: Yearly influenza, covid vaccine  Vaccines administered today: Declines vaccines today   Screening for cancer: Colon cancer screening: Due at this time.  Cologuard negative 2020.  We will plan to do cologuard unless labs show change in hemoglobin.    Prostate  Cancer screening: The recommended prostate cancer screening test is a blood test called the prostate-specific antigen (PSA) test. PSA is a protein that is made in the prostate. As you age, your prostate naturally produces more PSA. Abnormally high PSA levels may be caused by: Prostate cancer. An enlarged prostate that is not caused by cancer (benign prostatic hyperplasia, or BPH). This condition is very common in older men. A prostate gland infection (prostatitis) or urinary tract infection. Certain medicines such as male hormones (like testosterone) or other medicines that raise testosterone levels. A rectal exam may be done as part of prostate cancer screening to help provide information about the size of your prostate gland. When a rectal exam is performed, it should be done after the PSA level is drawn to avoid any effect on the results.   Skin cancer screening: Check your skin regularly for new changes, growing lesions, or other lesions of concern Come in for evaluation if you have skin lesions of concern.   Lung cancer screening: If you have a greater than 20 pack year history of tobacco use, then you may qualify for lung cancer screening with a chest CT scan.   Please call your insurance company to inquire about coverage for this test.   Pancreatic cancer:  no current screening test is available or routinely recommended. (risk factors: smoking, overweight or obese, diabetes, chronic pancreatitis, work exposure - dry cleaning, metal working, 57yo>, M>F, Tree surgeon, family hx/o, hereditary breast, ovarian, melanoma, lynch, peutz-jeghers).  Symptoms: jaundice, dark urine, light color or greasy stools, itchy skin, belly or back pain, weight loss, poor appetite, nause, vomiting, liver enlargement, DVT/blood clots.   We currently don't have screenings for other cancers besides breast, cervical, colon, and  lung cancers.  If you have a strong family history of cancer or have other cancer  screening concerns, please let me know.  Genetic testing referral is an option for individuals with high cancer risk in the family.  There are some other cancer screenings in development currently.   Bone health: Get at least 150 minutes of aerobic exercise weekly Get weight bearing exercise at least once weekly Bone density test:  A bone density test is an imaging test that uses a type of X-ray to measure the amount of calcium and other minerals in your bones. The test may be used to diagnose or screen you for a condition that causes weak or thin bones (osteoporosis), predict your risk for a broken bone (fracture), or determine how well your osteoporosis treatment is working. The bone density test is recommended for females 65 and older, or females or males <65 if certain risk factors such as thyroid disease, long term use of steroids such as for asthma or rheumatological issues, vitamin D deficiency, estrogen deficiency, family history of osteoporosis, self or family history of fragility fracture in first degree relative.    Heart health: Get at least 150 minutes of aerobic exercise weekly Limit alcohol It is important to maintain a healthy blood pressure and healthy cholesterol numbers  Heart disease screening: Screening for heart disease includes screening for blood pressure, fasting lipids, glucose/diabetes screening, BMI height to weight ratio, reviewed of smoking status, physical activity, and diet.    Goals include blood pressure 120/80 or less, maintaining a healthy lipid/cholesterol profile, preventing diabetes or keeping diabetes numbers under good control, not smoking or using tobacco products, exercising most days per week or at least 150 minutes per week of exercise, and eating healthy variety of fruits and vegetables, healthy oils, and avoiding unhealthy food choices like fried food, fast food, high sugar and high cholesterol foods.    Other tests may possibly include EKG test,  CT coronary calcium score, echocardiogram, exercise treadmill stress test.      Counseled on limiting salt, consider compression hose or compression socks on left leg, continue regular exercise, and follow up with cardiology as planned.     Vascular disease screening: For higher risk individuals including smokers, diabetics, patients with known heart disease or high blood pressure, kidney disease, and others, screening for vascular disease or atherosclerosis of the arteries is available.  Examples may include carotid ultrasound, abdominal aortic ultrasound, ABI blood flow screening in the legs, thoracic aorta screening.   Medical care options: I recommend you continue to seek care here first for routine care.  We try really hard to have available appointments Monday through Friday daytime hours for sick visits, acute visits, and physicals.  Urgent care should be used for after hours and weekends for significant issues that cannot wait till the next day.  The emergency department should be used for significant potentially life-threatening emergencies.  The emergency department is expensive, can often have long wait times for less significant concerns, so try to utilize primary care, urgent care, or telemedicine when possible to avoid unnecessary trips to the emergency department.  Virtual visits and telemedicine have been introduced since the pandemic started in 2020, and can be convenient ways to receive medical care.  We offer virtual appointments as well to assist you in a variety of options to seek medical care.   Legal  Take the time to do a last will and testament, Advanced Directives including Health Care Power of [place] and Living  Will documents.  Don't leave your family with burdens that can be handled ahead of time.   Advanced Directives: I recommend you consider completing a Health Care Power of Attorney and Living Will.   These documents respect your wishes and help alleviate burdens  on your loved ones if you were to become terminally ill or be in a position to need those documents enforced.    You can complete Advanced Directives yourself, have them notarized, then have copies made for our office, for you and for anybody you feel should have them in safe keeping.  Or, you can have an attorney prepare these documents.   If you haven't updated your Last Will and Testament in a while, it may be worthwhile having an attorney prepare these documents together and save on some costs.       Spiritual and Emotional Health Keeping a healthy spiritual life can help you better manage your physical health. Your spiritual life can help you to cope with any issues that may arise with your physical health.  Balance can keep [place] healthy and help [place] to recover.  If you are struggling with your spiritual health there are questions that you may want to ask yourself:  What makes me feel most complete? When do I feel most connected to the rest of the world? Where do I find the most inner strength? What am I doing when I feel whole?  Helpful tips: Being in nature. Some people feel very connected and at peace when they are walking outdoors or are outside. Helping others. Some feel the largest sense of wellbeing when they are of service to others. Being of service can take on many forms. It can be doing volunteer work, being kind to strangers, or offering a hand to a friend in need. Gratitude. Some people find they feel the most connected when they remain grateful. They may make lists of all the things they are grateful for or say a thank you out loud for all they have.    Emotional Health Are you in tune with your emotional health?  Check out this link: http://www.marquez-love.com/    Financial Health Make sure you use a budget for your personal finances Make sure you are insured against risks (health insurance, life insurance, auto insurance, etc) Save more, spend less Set  financial goals If you need help in this area, good resources include counseling through [employer] or other community resources, have a meeting with a Social research officer, government, and a good resource is the [employer]

## 2023-02-03 LAB — COMPREHENSIVE METABOLIC PANEL
ALT: 26 IU/L (ref 0–44)
AST: 25 IU/L (ref 0–40)
Albumin: 4.1 g/dL (ref 3.8–4.9)
Alkaline Phosphatase: 109 IU/L (ref 44–121)
BUN/Creatinine Ratio: 17 (ref 9–20)
BUN: 15 mg/dL (ref 6–24)
Bilirubin Total: 1.1 mg/dL (ref 0.0–1.2)
CO2: 26 mmol/L (ref 20–29)
Calcium: 9.5 mg/dL (ref 8.7–10.2)
Chloride: 99 mmol/L (ref 96–106)
Creatinine, Ser: 0.87 mg/dL (ref 0.76–1.27)
Globulin, Total: 2.6 g/dL (ref 1.5–4.5)
Glucose: 141 mg/dL — ABNORMAL HIGH (ref 70–99)
Potassium: 4.7 mmol/L (ref 3.5–5.2)
Sodium: 139 mmol/L (ref 134–144)
Total Protein: 6.7 g/dL (ref 6.0–8.5)
eGFR: 101 mL/min/{1.73_m2} (ref >59)

## 2023-02-03 LAB — CBC WITH DIFFERENTIAL/PLATELET
Basophils Absolute: 0 10*3/uL (ref 0.0–0.2)
Basos: 1 %
EOS (ABSOLUTE): 0.2 10*3/uL (ref 0.0–0.4)
Eos: 4 %
Hematocrit: 40.2 % (ref 37.5–51.0)
Hemoglobin: 13.1 g/dL (ref 13.0–17.7)
Immature Grans (Abs): 0 10*3/uL (ref 0.0–0.1)
Immature Granulocytes: 0 %
Lymphocytes Absolute: 1 10*3/uL (ref 0.7–3.1)
Lymphs: 22 %
MCH: 29 pg (ref 26.6–33.0)
MCHC: 32.6 g/dL (ref 31.5–35.7)
MCV: 89 fL (ref 79–97)
Monocytes Absolute: 0.6 10*3/uL (ref 0.1–0.9)
Monocytes: 12 %
Neutrophils Absolute: 2.9 10*3/uL (ref 1.4–7.0)
Neutrophils: 61 %
Platelets: 206 10*3/uL (ref 150–450)
RBC: 4.51 x10E6/uL (ref 4.14–5.80)
RDW: 15.5 % — ABNORMAL HIGH (ref 11.6–15.4)
WBC: 4.7 10*3/uL (ref 3.4–10.8)

## 2023-02-03 LAB — PSA: Prostate Specific Ag, Serum: 1.2 ng/mL (ref 0.0–4.0)

## 2023-02-03 LAB — HEMOGLOBIN A1C
Est. average glucose Bld gHb Est-mCnc: 126 mg/dL
Hgb A1c MFr Bld: 6 % — ABNORMAL HIGH (ref 4.8–5.6)

## 2023-02-03 LAB — LIPID PANEL
Chol/HDL Ratio: 4.6 ratio (ref 0.0–5.0)
Cholesterol, Total: 203 mg/dL — ABNORMAL HIGH (ref 100–199)
HDL: 44 mg/dL (ref >39)
LDL Chol Calc (NIH): 124 mg/dL — ABNORMAL HIGH (ref 0–99)
Triglycerides: 199 mg/dL — ABNORMAL HIGH (ref 0–149)
VLDL Cholesterol Cal: 35 mg/dL (ref 5–40)

## 2023-02-03 NOTE — Progress Notes (Signed)
Results sent through MyChart

## 2023-02-08 ENCOUNTER — Other Ambulatory Visit: Payer: Self-pay | Admitting: Medical

## 2023-02-08 ENCOUNTER — Encounter: Payer: Self-pay | Admitting: Internal Medicine

## 2023-02-08 MED ORDER — ROSUVASTATIN CALCIUM 10 MG PO TABS
10.0000 mg | ORAL_TABLET | Freq: Every day | ORAL | 0 refills | Status: DC
Start: 2023-02-08 — End: 2024-03-13

## 2023-02-08 MED ORDER — METFORMIN HCL 500 MG PO TABS: 500.0000 mg | ORAL_TABLET | Freq: Every day | ORAL | 0 refills | Status: DC

## 2023-02-10 ENCOUNTER — Encounter (HOSPITAL_COMMUNITY): Payer: Self-pay | Admitting: Physician Assistant

## 2023-02-10 ENCOUNTER — Ambulatory Visit (HOSPITAL_COMMUNITY)
Admission: RE | Admit: 2023-02-10 | Discharge: 2023-02-10 | Disposition: A | Discharge status: Home / Self Care | Source: Ambulatory Visit | Attending: Physician Assistant | Admitting: Physician Assistant

## 2023-02-10 ENCOUNTER — Ambulatory Visit: Admitting: Cardiology

## 2023-02-10 VITALS — BP 132/90 | HR 66 | Ht 74.0 in | Wt 232.0 lb

## 2023-02-10 DIAGNOSIS — E785 Hyperlipidemia, unspecified: Secondary | ICD-10-CM | POA: Insufficient documentation

## 2023-02-10 DIAGNOSIS — I4892 Unspecified atrial flutter: Secondary | ICD-10-CM | POA: Insufficient documentation

## 2023-02-10 DIAGNOSIS — I4819 Other persistent atrial fibrillation: Secondary | ICD-10-CM | POA: Insufficient documentation

## 2023-02-10 DIAGNOSIS — Z5181 Encounter for therapeutic drug level monitoring: Secondary | ICD-10-CM | POA: Diagnosis not present

## 2023-02-10 DIAGNOSIS — I2510 Atherosclerotic heart disease of native coronary artery without angina pectoris: Secondary | ICD-10-CM | POA: Insufficient documentation

## 2023-02-10 DIAGNOSIS — G4733 Obstructive sleep apnea (adult) (pediatric): Secondary | ICD-10-CM | POA: Diagnosis not present

## 2023-02-10 DIAGNOSIS — Z79899 Other long term (current) drug therapy: Secondary | ICD-10-CM | POA: Diagnosis not present

## 2023-02-10 DIAGNOSIS — I480 Paroxysmal atrial fibrillation: Secondary | ICD-10-CM

## 2023-02-10 DIAGNOSIS — Z7901 Long term (current) use of anticoagulants: Secondary | ICD-10-CM | POA: Insufficient documentation
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
## 2023-03-03 ENCOUNTER — Telehealth: Payer: Self-pay | Admitting: Medical

## 2023-03-03 NOTE — Telephone Encounter (Signed)
Pt needs new Rx for prosthetic leg sent to [facility], he is supposed to get a new one on Friday & needs Rx

## 2023-03-04 ENCOUNTER — Ambulatory Visit: Admitting: Cardiology

## 2023-03-04 NOTE — Telephone Encounter (Signed)
There is no paperwork, he said you just have to write out a prescription and fax it to [facility]

## 2023-03-05 NOTE — Telephone Encounter (Signed)
I have faxed this over to [facility]

## 2023-04-20 ENCOUNTER — Ambulatory Visit: Attending: Cardiology | Admitting: Cardiology

## 2023-04-20 ENCOUNTER — Encounter: Payer: Self-pay | Admitting: Cardiology

## 2023-04-20 VITALS — BP 180/98 | HR 70 | Ht 74.0 in | Wt 231.2 lb

## 2023-04-20 DIAGNOSIS — I483 Typical atrial flutter: Secondary | ICD-10-CM

## 2023-04-20 DIAGNOSIS — I480 Paroxysmal atrial fibrillation: Secondary | ICD-10-CM

## 2023-04-20 NOTE — Patient Instructions (Signed)
Medication Instructions:  Your physician has recommended you make the following change in your medication:  STOP Flecainide STOP Eliquis  *If you need a refill on your cardiac medications before your next appointment, please call your pharmacy*   Lab Work: None ordered   Testing/Procedures: None ordered   Follow-Up: At [facility], you and your health needs are our priority.  As part of our continuing mission to provide you with exceptional heart care, we have created designated Provider Care Teams.  These Care Teams include your primary Cardiologist (physician) and Advanced Practice Providers (APPs -  Physician Assistants and Nurse Practitioners) who all work together to provide you with the care you need, when you need it.  Your next appointment:   6 month(s)  The format for your next appointment:   In Person  Provider:   You will follow up in the Atrial Fibrillation Clinic located at [facility]. Your provider will be: Clint R. Fenton, PA-C or Lake Bells, PA-C    Thank you for choosing [facility]!!   Dory Horn, RN [phone]  Other Instructions

## 2023-04-20 NOTE — Progress Notes (Signed)
  Electrophysiology Office Note:   Date:  04/20/2023  ID:  Jon [name], DOB Aug 02, 1965, MRN [id number]  Primary Cardiologist: None Electrophysiologist: Jon Florek Jorja Loa, MD      History of Present Illness:   Jon [name] is a 57 y.o. male with h/o atrial fibrillation/flutter seen today for routine electrophysiology followup.   Since last being seen in our clinic the patient reports doing well.  Since his ablation, he has noted no further atrial fibrillation.  He has been able to do all of his daily activities without restriction.  He has no acute complaints.  His blood pressure is elevated today.  He states that it is more normal at home.  he denies chest pain, palpitations, dyspnea, PND, orthopnea, nausea, vomiting, dizziness, syncope, edema, weight gain, or early satiety.   Review of systems complete and found to be negative unless listed in HPI.   EP Information / Studies Reviewed:    EKG is ordered today. Personal review as below.  EKG Interpretation Date/Time:  Tuesday April 20 2023 12:05:05 EST Ventricular Rate:  70 PR Interval:  140 QRS Duration:  98 QT Interval:  374 QTC Calculation: 403 R Axis:   73  Text Interpretation: Normal sinus rhythm Normal ECG When compared with ECG of 10-Feb-2023 10:24, No significant change was found Confirmed by Jon [name] ([id number]) on 04/20/2023 12:10:20 PM     Risk Assessment/Calculations:    CHA2DS2-VASc Score = 1   This indicates a 0.6% annual risk of stroke. The patient's score is based upon: CHF History: 0 HTN History: 0 Diabetes History: 0 Stroke History: 0 Vascular Disease History: 1 (CAD on CT) Age Score: 0 Gender Score: 0     Physical Exam:   VS:  BP (!) 180/98 (BP Location: Left Arm, Patient Position: Sitting, Cuff Size: Large)   Pulse 70   Ht 6\' 2"  (1.88 m)   Wt 231 lb 3.2 oz (104.9 kg)   SpO2 97%   BMI 29.68 kg/m    Wt Readings from Last 3 Encounters:  04/20/23 231 lb 3.2 oz (104.9 kg)  02/10/23 232 lb  (105.2 kg)  02/02/23 232 lb 12.8 oz (105.6 kg)     GEN: Well nourished, well developed in no acute distress NECK: No JVD; No carotid bruits CARDIAC: Regular rate and rhythm, no murmurs, rubs, gallops RESPIRATORY:  Clear to auscultation without rales, wheezing or rhonchi  ABDOMEN: Soft, non-tender, non-distended EXTREMITIES:  No edema; No deformity   ASSESSMENT AND PLAN:    1.  Paroxysmal atrial fibrillation: Post ablation 01/09/2023.  He has remained in sinus rhythm.  Jon [name] stop flecainide and Eliquis today.  2.  Typical atrial flutter: Post ablation in 2017.  No obvious recurrence  3.  Elevated blood pressures: Blood pressure is elevated today.  It has been elevated in the past as well as normal in the past.  He Jon [name] check it at home and discuss further with his primary physician.   Follow up with Afib Clinic in 6 months  Signed, Jon Swo[place]and Jorja Loa, MD

## 2023-04-21 NOTE — Telephone Encounter (Signed)
Left detailed message that ok to start donating blood again from our standpoint.  Aware I will follow up about messages

## 2023-04-26 ENCOUNTER — Encounter: Payer: Self-pay | Admitting: Cardiology

## 2023-04-26 ENCOUNTER — Other Ambulatory Visit: Payer: 59

## 2023-04-26 ENCOUNTER — Telehealth: Payer: Self-pay

## 2023-04-26 NOTE — Telephone Encounter (Signed)
Pt. Walked in for BP check and it was 142/82

## 2023-08-30 ENCOUNTER — Other Ambulatory Visit: Payer: Self-pay | Admitting: Family Medicine

## 2023-08-30 ENCOUNTER — Ambulatory Visit: Payer: Self-pay

## 2023-08-30 DIAGNOSIS — Z021 Encounter for pre-employment examination: Secondary | ICD-10-CM

## 2024-03-13 ENCOUNTER — Encounter (HOSPITAL_COMMUNITY): Payer: Self-pay | Admitting: Internal Medicine

## 2024-03-13 ENCOUNTER — Ambulatory Visit (HOSPITAL_COMMUNITY)
Admission: RE | Admit: 2024-03-13 | Discharge: 2024-03-13 | Disposition: A | Discharge status: Home / Self Care | Source: Ambulatory Visit | Attending: Internal Medicine | Admitting: Internal Medicine

## 2024-03-13 VITALS — BP 146/80 | HR 73 | Ht 74.0 in | Wt 245.8 lb

## 2024-03-13 DIAGNOSIS — I480 Paroxysmal atrial fibrillation: Secondary | ICD-10-CM | POA: Diagnosis not present

## 2024-03-13 DIAGNOSIS — I4819 Other persistent atrial fibrillation: Secondary | ICD-10-CM | POA: Diagnosis not present
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
## 2024-04-11 ENCOUNTER — Telehealth: Payer: Self-pay | Admitting: Medical

## 2024-04-11 NOTE — Telephone Encounter (Signed)
 Patient came in and asked for an Rx to be sent in for prosthetic supplies please

## 2024-05-02 ENCOUNTER — Ambulatory Visit: Payer: No Typology Code available for payment source | Admitting: Orthopedic Surgery

## 2024-05-02 DIAGNOSIS — Z89511 Acquired absence of right leg below knee: Secondary | ICD-10-CM | POA: Diagnosis not present

## 2024-05-03 ENCOUNTER — Encounter: Payer: Self-pay | Admitting: Orthopedic Surgery
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
